# Patient Record
Sex: Female | Born: 1995 | Race: White | Hispanic: No | Marital: Single | Smoking: Current every day smoker
Health system: Southern US, Community
[De-identification: ages and names within clinical notes are randomized; demographics above are authoritative.]

## PROBLEM LIST (undated history)

## (undated) DIAGNOSIS — M6282 Rhabdomyolysis: Secondary | ICD-10-CM

## (undated) DIAGNOSIS — Z789 Other specified health status: Secondary | ICD-10-CM

## (undated) DIAGNOSIS — N179 Acute kidney failure, unspecified: Secondary | ICD-10-CM

## (undated) HISTORY — DX: Rhabdomyolysis: M62.82

## (undated) HISTORY — DX: Acute kidney failure, unspecified: N17.9

---
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
## 2018-11-20 ENCOUNTER — Inpatient Hospital Stay (HOSPITAL_COMMUNITY)
Admission: EM | Admit: 2018-11-20 | Discharge: 2018-11-26 | DRG: 917 | Disposition: A | Discharge status: Home / Self Care | Payer: BC Managed Care – PPO | Attending: Internal Medicine | Admitting: Internal Medicine

## 2018-11-20 ENCOUNTER — Emergency Department (HOSPITAL_COMMUNITY): Payer: BC Managed Care – PPO

## 2018-11-20 ENCOUNTER — Encounter (HOSPITAL_COMMUNITY): Payer: Self-pay | Admitting: Emergency Medicine

## 2018-11-20 ENCOUNTER — Other Ambulatory Visit: Payer: Self-pay

## 2018-11-20 DIAGNOSIS — IMO0002 Reserved for concepts with insufficient information to code with codable children: Secondary | ICD-10-CM

## 2018-11-20 DIAGNOSIS — F17290 Nicotine dependence, other tobacco product, uncomplicated: Secondary | ICD-10-CM | POA: Diagnosis present

## 2018-11-20 DIAGNOSIS — E872 Acidosis: Secondary | ICD-10-CM | POA: Diagnosis present

## 2018-11-20 DIAGNOSIS — N179 Acute kidney failure, unspecified: Secondary | ICD-10-CM | POA: Diagnosis present

## 2018-11-20 DIAGNOSIS — W503XXA Accidental bite by another person, initial encounter: Secondary | ICD-10-CM | POA: Diagnosis present

## 2018-11-20 DIAGNOSIS — F1210 Cannabis abuse, uncomplicated: Secondary | ICD-10-CM | POA: Diagnosis present

## 2018-11-20 DIAGNOSIS — D649 Anemia, unspecified: Secondary | ICD-10-CM | POA: Diagnosis present

## 2018-11-20 DIAGNOSIS — G40509 Epileptic seizures related to external causes, not intractable, without status epilepticus: Secondary | ICD-10-CM | POA: Diagnosis present

## 2018-11-20 DIAGNOSIS — Z793 Long term (current) use of hormonal contraceptives: Secondary | ICD-10-CM | POA: Diagnosis not present

## 2018-11-20 DIAGNOSIS — D72829 Elevated white blood cell count, unspecified: Secondary | ICD-10-CM | POA: Diagnosis present

## 2018-11-20 DIAGNOSIS — G92 Toxic encephalopathy: Secondary | ICD-10-CM | POA: Diagnosis present

## 2018-11-20 DIAGNOSIS — R030 Elevated blood-pressure reading, without diagnosis of hypertension: Secondary | ICD-10-CM | POA: Diagnosis present

## 2018-11-20 DIAGNOSIS — F1010 Alcohol abuse, uncomplicated: Secondary | ICD-10-CM | POA: Diagnosis present

## 2018-11-20 DIAGNOSIS — R809 Proteinuria, unspecified: Secondary | ICD-10-CM | POA: Diagnosis present

## 2018-11-20 DIAGNOSIS — R823 Hemoglobinuria: Secondary | ICD-10-CM | POA: Diagnosis present

## 2018-11-20 DIAGNOSIS — R509 Fever, unspecified: Secondary | ICD-10-CM | POA: Diagnosis not present

## 2018-11-20 DIAGNOSIS — T407X1A Poisoning by cannabis (derivatives), accidental (unintentional), initial encounter: Secondary | ICD-10-CM | POA: Diagnosis present

## 2018-11-20 DIAGNOSIS — M6282 Rhabdomyolysis: Secondary | ICD-10-CM | POA: Diagnosis present

## 2018-11-20 DIAGNOSIS — Z1159 Encounter for screening for other viral diseases: Secondary | ICD-10-CM

## 2018-11-20 DIAGNOSIS — R7989 Other specified abnormal findings of blood chemistry: Secondary | ICD-10-CM

## 2018-11-20 DIAGNOSIS — R569 Unspecified convulsions: Secondary | ICD-10-CM

## 2018-11-20 DIAGNOSIS — G9340 Encephalopathy, unspecified: Secondary | ICD-10-CM | POA: Diagnosis present

## 2018-11-20 DIAGNOSIS — R740 Nonspecific elevation of levels of transaminase and lactic acid dehydrogenase [LDH]: Secondary | ICD-10-CM | POA: Diagnosis present

## 2018-11-20 DIAGNOSIS — R824 Acetonuria: Secondary | ICD-10-CM | POA: Diagnosis present

## 2018-11-20 DIAGNOSIS — R81 Glycosuria: Secondary | ICD-10-CM | POA: Diagnosis present

## 2018-11-20 DIAGNOSIS — S01512A Laceration without foreign body of oral cavity, initial encounter: Secondary | ICD-10-CM | POA: Diagnosis present

## 2018-11-20 DIAGNOSIS — R945 Abnormal results of liver function studies: Secondary | ICD-10-CM | POA: Diagnosis not present

## 2018-11-20 HISTORY — DX: Other specified health status: Z78.9

## 2018-11-20 LAB — COMPREHENSIVE METABOLIC PANEL
ALT: 29 U/L (ref 0–44)
AST: 63 U/L — ABNORMAL HIGH (ref 15–41)
Albumin: 4.5 g/dL (ref 3.5–5.0)
Alkaline Phosphatase: 55 U/L (ref 38–126)
Anion gap: 13 (ref 5–15)
BUN: 24 mg/dL — ABNORMAL HIGH (ref 6–20)
CO2: 16 mmol/L — ABNORMAL LOW (ref 22–32)
Calcium: 9.6 mg/dL (ref 8.9–10.3)
Chloride: 110 mmol/L (ref 98–111)
Creatinine, Ser: 1.85 mg/dL — ABNORMAL HIGH (ref 0.44–1.00)
GFR calc Af Amer: 44 mL/min — ABNORMAL LOW (ref >60)
GFR calc non Af Amer: 38 mL/min — ABNORMAL LOW (ref >60)
Glucose, Bld: 136 mg/dL — ABNORMAL HIGH (ref 70–99)
Potassium: 4.8 mmol/L (ref 3.5–5.1)
Sodium: 139 mmol/L (ref 135–145)
Total Bilirubin: 1.1 mg/dL (ref 0.3–1.2)
Total Protein: 7.8 g/dL (ref 6.5–8.1)

## 2018-11-20 LAB — CSF CELL COUNT WITH DIFFERENTIAL
RBC Count, CSF: 126 /mm3 — ABNORMAL HIGH
RBC Count, CSF: 2 /mm3 — ABNORMAL HIGH
Tube #: 1
Tube #: 4
WBC, CSF: 1 /mm3 (ref 0–5)
WBC, CSF: 2 /mm3 (ref 0–5)

## 2018-11-20 LAB — CBC WITH DIFFERENTIAL/PLATELET
Abs Immature Granulocytes: 0.17 10*3/uL — ABNORMAL HIGH (ref 0.00–0.07)
Basophils Absolute: 0 10*3/uL (ref 0.0–0.1)
Basophils Relative: 0 %
Eosinophils Absolute: 0 10*3/uL (ref 0.0–0.5)
Eosinophils Relative: 0 %
HCT: 42 % (ref 36.0–46.0)
Hemoglobin: 14.3 g/dL (ref 12.0–15.0)
Immature Granulocytes: 1 %
Lymphocytes Relative: 1 %
Lymphs Abs: 0.2 10*3/uL — ABNORMAL LOW (ref 0.7–4.0)
MCH: 30.1 pg (ref 26.0–34.0)
MCHC: 34 g/dL (ref 30.0–36.0)
MCV: 88.4 fL (ref 80.0–100.0)
Monocytes Absolute: 0.8 10*3/uL (ref 0.1–1.0)
Monocytes Relative: 3 %
Neutro Abs: 22.5 10*3/uL — ABNORMAL HIGH (ref 1.7–7.7)
Neutrophils Relative %: 95 %
Platelets: 241 10*3/uL (ref 150–400)
RBC: 4.75 MIL/uL (ref 3.87–5.11)
RDW: 12.8 % (ref 11.5–15.5)
WBC: 24.5 10*3/uL — ABNORMAL HIGH (ref 4.0–10.5)
nRBC: 0 % (ref 0.0–0.2)

## 2018-11-20 LAB — HCG, QUANTITATIVE, PREGNANCY: hCG, Beta Chain, Quant, S: <1 m[IU]/mL (ref <5)

## 2018-11-20 LAB — URINALYSIS, ROUTINE W REFLEX MICROSCOPIC
Bacteria, UA: NONE SEEN
Bilirubin Urine: NEGATIVE
Glucose, UA: 50 mg/dL — AB
Ketones, ur: 5 mg/dL — AB
Leukocytes,Ua: NEGATIVE
Nitrite: NEGATIVE
Protein, ur: 100 mg/dL — AB
Specific Gravity, Urine: 1.012 (ref 1.005–1.030)
pH: 5 (ref 5.0–8.0)

## 2018-11-20 LAB — RAPID URINE DRUG SCREEN, HOSP PERFORMED
Amphetamines: NOT DETECTED
Barbiturates: NOT DETECTED
Benzodiazepines: NOT DETECTED
Cocaine: NOT DETECTED
Opiates: NOT DETECTED
Tetrahydrocannabinol: POSITIVE — AB

## 2018-11-20 LAB — PROTEIN AND GLUCOSE, CSF
Glucose, CSF: 88 mg/dL — ABNORMAL HIGH (ref 40–70)
Total  Protein, CSF: 23 mg/dL (ref 15–45)

## 2018-11-20 LAB — LACTIC ACID, PLASMA
Lactic Acid, Venous: 2.3 mmol/L (ref 0.5–1.9)
Lactic Acid, Venous: 1.7 mmol/L (ref 0.5–1.9)

## 2018-11-20 LAB — ETHANOL: Alcohol, Ethyl (B): <10 mg/dL (ref <10)

## 2018-11-20 LAB — MAGNESIUM: Magnesium: 2.8 mg/dL — ABNORMAL HIGH (ref 1.7–2.4)

## 2018-11-20 MED ORDER — ONDANSETRON HCL 4 MG/2ML IJ SOLN
4.0000 mg | Freq: Once | INTRAMUSCULAR | Status: AC
  Administered 2018-11-20: 17:00:00 4 mg via INTRAVENOUS

## 2018-11-20 MED ORDER — HALOPERIDOL LACTATE 5 MG/ML IJ SOLN
5.0000 mg | Freq: Once | INTRAMUSCULAR | Status: AC
  Administered 2018-11-20: 18:00:00 5 mg via INTRAVENOUS
  Filled 2018-11-20: qty 1

## 2018-11-20 MED ORDER — ACETAMINOPHEN 325 MG PO TABS
650.0000 mg | ORAL_TABLET | Freq: Four times a day (QID) | ORAL | Status: DC | PRN
  Administered 2018-11-22 – 2018-11-23 (×2): 650 mg via ORAL
  Filled 2018-11-20 (×2): qty 2

## 2018-11-20 MED ORDER — SODIUM CHLORIDE 0.9 % IV SOLN
INTRAVENOUS | Status: DC
  Administered 2018-11-20: 22:00:00 via INTRAVENOUS

## 2018-11-20 MED ORDER — LEVETIRACETAM IN NACL 1000 MG/100ML IV SOLN
1000.0000 mg | Freq: Once | INTRAVENOUS | Status: AC
  Administered 2018-11-20: 22:00:00 1000 mg via INTRAVENOUS
  Filled 2018-11-20: qty 100

## 2018-11-20 MED ORDER — LORAZEPAM 2 MG/ML IJ SOLN: 1.0000 mg | INTRAMUSCULAR | Status: DC | PRN

## 2018-11-20 MED ORDER — PROCHLORPERAZINE EDISYLATE 10 MG/2ML IJ SOLN: 5.0000 mg | INTRAMUSCULAR | Status: DC | PRN

## 2018-11-20 MED ORDER — LEVETIRACETAM IN NACL 500 MG/100ML IV SOLN
500.0000 mg | Freq: Two times a day (BID) | INTRAVENOUS | Status: DC
  Administered 2018-11-21 (×2): 500 mg via INTRAVENOUS
  Filled 2018-11-20 (×2): qty 100

## 2018-11-20 MED ORDER — LORAZEPAM 2 MG/ML IJ SOLN
1.0000 mg | Freq: Once | INTRAMUSCULAR | Status: AC
  Administered 2018-11-20: 1 mg via INTRAVENOUS
  Filled 2018-11-20: qty 1

## 2018-11-20 MED ORDER — ACETAMINOPHEN 650 MG RE SUPP: 650.0000 mg | Freq: Four times a day (QID) | RECTAL | Status: DC | PRN

## 2018-11-20 MED ORDER — ONDANSETRON HCL 4 MG/2ML IJ SOLN
INTRAMUSCULAR | Status: AC
  Administered 2018-11-20: 4 mg via INTRAVENOUS
  Filled 2018-11-20: qty 2

## 2018-11-20 MED ORDER — CHLORHEXIDINE GLUCONATE CLOTH 2 % EX PADS
6.0000 | MEDICATED_PAD | Freq: Every day | CUTANEOUS | Status: DC
  Administered 2018-11-21 – 2018-11-25 (×4): 6 via TOPICAL

## 2018-11-20 MED ORDER — LEVETIRACETAM IN NACL 1000 MG/100ML IV SOLN: 1000.0000 mg | Freq: Two times a day (BID) | INTRAVENOUS | Status: DC

## 2018-11-20 MED ORDER — SODIUM CHLORIDE 0.9 % IV BOLUS
1000.0000 mL | Freq: Once | INTRAVENOUS | Status: AC
  Administered 2018-11-20: 1000 mL via INTRAVENOUS

## 2018-11-20 MED ORDER — SODIUM CHLORIDE 0.9 % IV BOLUS
1000.0000 mL | Freq: Once | INTRAVENOUS | Status: AC
  Administered 2018-11-20: 22:00:00 1000 mL via INTRAVENOUS

## 2018-11-20 NOTE — ED Notes (Signed)
Father called. Updated that pt was going to be an admission

## 2018-11-20 NOTE — ED Notes (Signed)
hospitalist attempt to call father. Leaving voicemail

## 2018-11-20 NOTE — H&P (Signed)
History and Physical    Carla HeronJessica R Longino ZOX:[id number] DOB: 1995/06/24 DOA: 11/20/2018  PCP: Patient, No Pcp Per   Patient coming from: Home.  I have personally briefly reviewed patient[facility]  Chief Complaint: Seizure.  HPI: Carla Gross is a 23 y.o. female with no previous past medical history who is brought to the emergency department via EMS after having seizure-like activity at home.  The family reported that the patient drank about 4 white claws drinks (about 5% alcohol) over several hours while at the pool and got sick.  She has multiple episodes incontinence and emesis.  Today she had was seen to be seizure-like activity according to what the family witness and they gave her CBD oil under her tongue.  She is currently at times answering simple questions, but is disoriented and restless at times.  She is unable to provide further information.  ED Course: Initial vital signs temperature 98 F, pulse 56, respirations 16, blood pressure 124/67 mmHg and O2 sat 99% on room air.  She received a 1000 mL NS bolus, lorazepam and allopurinol prior to undergoing a lumbar puncture.  I added on another 1000 mL's of normal saline bolus.  Her urinalysis was hazy in appearance, high glucosuria of 50 mg/dL, large hemoglobinuria, ketonuria 5 and proteinuria of 100 mg/dL.  Microscopic examination only shows 0-5 RBC.  UDS was negative. CBC showed a white count of 24.5, hemoglobin 14.3 g/dL and platelets [id number].  Alcohol level was less than 10.  Sodium 139, potassium 4.8, chloride 110 and CO2 16 mmol/L.  Anion gap was 13.  Glucose was 136, BUN 24 and creatinine 1.85 mg/dL.  AST was 63, but all other hepatic function tests were within normal limits.  Serum hCG was negative.  HIV antibody and urine culture are pending.  Total CK was 3642.  Imaging: CT head and chest radiograph were within normal limits.  Review of Systems:  Unable to obtain.  History reviewed. No pertinent  past medical history.  History reviewed. No pertinent surgical history.  Her father reports that she has been smoking e-cigarettes. She does not have any smokeless tobacco history on file. She drinks alcohol on occasion and was THC positive on her UDS. However, she was given CBD earlier by her boyfriend.  No Known Allergies  Family History  Family history unknown: Yes    Prior to Admission medications   Not on File    Physical Exam: Vitals:   11/20/18 1930 11/20/18 2000 11/20/18 2030 11/20/18 2100  BP: 119/80 (!) 141/85 (!) 131/95 118/80  Pulse:    66  Resp:    16  Temp:      TempSrc:      SpO2:    98%  Weight:      Height:        Constitutional: Sedated and confused. Eyes: PERRL, lids and conjunctivae mildly injected. ENMT: Mucous membranes are dry. Posterior pharynx clear of any exudate or lesions. Neck: normal, supple, no masses, no thyromegaly Respiratory: Decreased breath sounds in bases, but otherwise clear to auscultation bilaterally, no wheezing, no crackles. Normal respiratory effort. No accessory muscle use.  Cardiovascular: Regular rate and rhythm, no murmurs / rubs / gallops. No extremity edema. 2+ pedal pulses. No carotid bruits.  Abdomen: Soft, no tenderness, no masses palpated. No hepatosplenomegaly. Bowel sounds positive.  Musculoskeletal: no clubbing / cyanosis. Good ROM, no contractures.  Relaxed muscle tone.  Skin: no rashes, lesions, ulcers on very limited  dermatological examination. Neurologic: Unable to fully evaluate, as the patient is unable to cooperate.  But the patient seems to be nonfocal. Psychiatric: Somnolent.  Wakes up briefly.  May or may not answer simple questions.  Labs on Admission: I have personally reviewed following labs and imaging studies  CBC: Recent Labs  Lab 11/20/18 1658  WBC 24.5*  NEUTROABS 22.5*  HGB 14.3  HCT 42.0  MCV 88.4  PLT 241   Basic Metabolic Panel: Recent Labs  Lab 11/20/18 1658  NA 139  K 4.8  CL  110  CO2 16*  GLUCOSE 136*  BUN 24*  CREATININE 1.85*  CALCIUM 9.6   GFR: Estimated Creatinine Clearance: 53.3 mL/min (A) (by C-G formula based on SCr of 1.85 mg/dL (H)). Liver Function Tests: Recent Labs  Lab 11/20/18 1658  AST 63*  ALT 29  ALKPHOS 55  BILITOT 1.1  PROT 7.8  ALBUMIN 4.5   No results for input(s): LIPASE, AMYLASE in the last 168 hours. No results for input(s): AMMONIA in the last 168 hours. Coagulation Profile: No results for input(s): INR, PROTIME in the last 168 hours. Cardiac Enzymes: No results for input(s): CKTOTAL, CKMB, CKMBINDEX, TROPONINI in the last 168 hours. BNP (last 3 results) No results for input(s): PROBNP in the last 8760 hours. HbA1C: No results for input(s): HGBA1C in the last 72 hours. CBG: No results for input(s): GLUCAP in the last 168 hours. Lipid Profile: No results for input(s): CHOL, HDL, LDLCALC, TRIG, CHOLHDL, LDLDIRECT in the last 72 hours. Thyroid Function Tests: No results for input(s): TSH, T4TOTAL, FREET4, T3FREE, THYROIDAB in the last 72 hours. Anemia Panel: No results for input(s): VITAMINB12, FOLATE, FERRITIN, TIBC, IRON, RETICCTPCT in the last 72 hours. Urine analysis:    Component Value Date/Time   COLORURINE YELLOW 11/20/2018 1810   APPEARANCEUR HAZY (A) 11/20/2018 1810   LABSPEC 1.012 11/20/2018 1810   PHURINE 5.0 11/20/2018 1810   GLUCOSEU 50 (A) 11/20/2018 1810   HGBUR LARGE (A) 11/20/2018 1810   BILIRUBINUR NEGATIVE 11/20/2018 1810   KETONESUR 5 (A) 11/20/2018 1810   PROTEINUR 100 (A) 11/20/2018 1810   NITRITE NEGATIVE 11/20/2018 1810   LEUKOCYTESUR NEGATIVE 11/20/2018 1810    Radiological Exams on Admission: Ct Head Wo Contrast  Result Date: 11/20/2018 CLINICAL DATA:  Per ED note. Pt brought in by ems after family called for possible seizure. Family reports she drank a few white claws yesterday by the pool and got sick. Pt had multiple episodes of urinating on self with vomiting. Today had a possible  seizure per ems and family gave cbd oil under tongue. EXAM: CT HEAD WITHOUT CONTRAST TECHNIQUE: Contiguous axial images were obtained from the base of the skull through the vertex without intravenous contrast. COMPARISON:  None. FINDINGS: Brain: No evidence of acute infarction, hemorrhage, hydrocephalus, extra-axial collection or mass lesion/mass effect. Vascular: No hyperdense vessel or unexpected calcification. Skull: Normal. Negative for fracture or focal lesion. Sinuses/Orbits: Globes and orbits are unremarkable. Mucous retention cyst in the left sphenoid sinus. Mild, right greater than left, polypoid mucosal thickening in the inferior maxillary sinuses. Clear mastoid air cells. Other: None. IMPRESSION: 1. No intracranial abnormality. 2. Sinus mucosal thickening as described. Electronically Signed   By: Amie Portlandavid  Ormond M.D.   On: 11/20/2018 17:57   Dg Chest Portable 1 View  Result Date: 11/20/2018 CLINICAL DATA:  Altered mental status. EXAM: PORTABLE CHEST 1 VIEW COMPARISON:  None. FINDINGS: Normal heart, mediastinum and hila. Clear lungs. No evidence of a pleural effusion. No  gross pneumothorax on this supine exam. Skeletal structures are unremarkable. IMPRESSION: No active disease. Electronically Signed   By: Lajean Manes M.D.   On: 11/20/2018 17:58    EKG: Independently reviewed. Vent. rate 62 BPM PR interval * ms QRS duration 87 ms QT/QTc 435/442 ms P-R-T axes 17 77 55 Sinus arrhythmia  Assessment/Plan Principal Problem:   Acute encephalopathy Admit to stepdown/inpatient. Frequent neuro checks. Continue IV fluids. Check CBG every 6 hours. Follow-up CBC and CMP in a.m. Further treatment as below for seizure.  Active Problems:   Seizure [facility]) Discussed briefly with neurology on call. In addition to EEG, and MRI. They suggested to use Keppra preventatively. If EEG is negative, Keppra should be taper off. Lorazepam as needed. Will check 9 substance UDS in the morning. Monitor CBG  every 6 hours.    AKI (acute kidney injury) ([place]) Continue IV fluids. Monitor intake and output.  CK level. Follow-up renal function and electrolytes.    Leukocytosis No fever, checks x-ray clear, UA noninfectious. Might be related to the stress of the last 24 hours. Continue current treatment and recheck CBC in a.m.   DVT prophylaxis: SCDs. Code Status: Full code. Family Communication:  Discussed at length with her father Dominica Severin.   He would like to be updated daily. Disposition Plan:  Admit for further treatment and work-up for 2 to 3 days as earlier stated. Consults called: Admission status:  Stepdown/inpatient.   Reubin Milan MD [facility]  11/20/2018, 9:41 PM  This document was prepared using Dragon voice recognition software and may contain some unintended transcription errors.

## 2018-11-20 NOTE — ED Notes (Signed)
hospitalist in room  

## 2018-11-20 NOTE — ED Triage Notes (Signed)
Pt brought in by ems after family called for possible seizure. Family reports she drank a few white claws yesterday by the pool and got sick. Pt had multiple episodes of urinating on self with vomiting. Today had a possible seizure per ems and family gave cbd oil under tongue.

## 2018-11-20 NOTE — ED Notes (Signed)
Date and time results received: 11/20/18 6:38 PM  (use smartphrase ".now" to insert current time)  Test: Lactic Critical Value: 2.3  Name of Provider Notified: Kohut  Orders Received? Or Actions Taken?: Orders Received - See Orders for details

## 2018-11-20 NOTE — ED Provider Notes (Signed)
[facility] EMERGENCY DEPARTMENT Provider Note   CSN: [id number] Arrival date & time: 11/20/18  1632     History   Chief Complaint Chief Complaint  Patient presents with  . Altered Mental Status    HPI Carla Gross is a 23 y.o. female.     HPI   22yF with AMS. Drinking at the pool yesterday and developed nausea and vomiting. That continued today and also incontinent of urine. Possible seizure today w/ no reported prior seizure history. She is answering questions but clearly confused. Says she feels sick but has a hard time describing symptoms beyond this. She is restless and and keeps repositioning herself in the bed, pulling off her gown/covers, etc.   History reviewed. No pertinent past medical history.  There are no active problems to display for this patient.   History reviewed. No pertinent surgical history.   OB History   No obstetric history on file.      Home Medications    Prior to Admission medications   Not on File    Family History History reviewed. No pertinent family history.  Social History Social History   Tobacco Use  . Smoking status: Not on file  Substance Use Topics  . Alcohol use: Not on file  . Drug use: Not on file     Allergies   Patient has no known allergies.   Review of Systems Review of Systems  Level 5 caveat because of confusion.  Physical Exam Updated Vital Signs BP 124/80   Pulse (!) 58   Temp 98 F (36.7 C)   Resp 18   Ht 5\' 7"  (1.702 m)   Wt 86.2 kg   SpO2 99%   BMI 29.76 kg/m   Physical Exam Vitals signs and nursing note reviewed.  Constitutional:      Appearance: She is well-developed.     Comments: Laying in bed awake. Restless. Keeps repositioning herself in the bed. Incontinent of urine.   HENT:     Head: Normocephalic and atraumatic.  Eyes:     General:        Right eye: No discharge.        Left eye: No discharge.     Conjunctiva/sclera: Conjunctivae normal.  Neck:   Musculoskeletal: Neck supple. No neck rigidity.  Cardiovascular:     Rate and Rhythm: Normal rate and regular rhythm.     Heart sounds: Normal heart sounds. No murmur. No friction rub. No gallop.   Pulmonary:     Effort: Pulmonary effort is normal. No respiratory distress.     Breath sounds: Normal breath sounds.  Abdominal:     General: There is no distension.     Palpations: Abdomen is soft.     Tenderness: There is no abdominal tenderness.  Musculoskeletal:        General: No tenderness.  Skin:    General: Skin is warm and dry.  Neurological:     Mental Status: She is alert.     Comments: Able to tell me the correct month but that 2019. Able to recognize that she is in the hospital but cannot explain to me the events leading up to this or provide a reason why she may be here. She will follow simple commands but has to be constantly reminded and will seem to lose her train of thought.       ED Treatments / Results  Labs (all labs ordered are listed, but only abnormal results are displayed) Labs Reviewed  RAPID URINE DRUG SCREEN, HOSP PERFORMED - Abnormal; Notable for the following components:      Result Value   Tetrahydrocannabinol POSITIVE (*)    All other components within normal limits  CBC WITH DIFFERENTIAL/PLATELET - Abnormal; Notable for the following components:   WBC 24.5 (*)    Neutro Abs 22.5 (*)    Lymphs Abs 0.2 (*)    Abs Immature Granulocytes 0.17 (*)    All other components within normal limits  COMPREHENSIVE METABOLIC PANEL - Abnormal; Notable for the following components:   CO2 16 (*)    Glucose, Bld 136 (*)    BUN 24 (*)    Creatinine, Ser 1.85 (*)    AST 63 (*)    GFR calc non Af Amer 38 (*)    GFR calc Af Amer 44 (*)    All other components within normal limits  URINALYSIS, ROUTINE W REFLEX MICROSCOPIC - Abnormal; Notable for the following components:   APPearance HAZY (*)    Glucose, UA 50 (*)    Hgb urine dipstick LARGE (*)    Ketones, ur 5  (*)    Protein, ur 100 (*)    All other components within normal limits  LACTIC ACID, PLASMA - Abnormal; Notable for the following components:   Lactic Acid, Venous 2.3 (*)    All other components within normal limits  CSF CELL COUNT WITH DIFFERENTIAL - Abnormal; Notable for the following components:   Appearance, CSF CLEAR (*)    RBC Count, CSF 126 (*)    All other components within normal limits  CSF CELL COUNT WITH DIFFERENTIAL - Abnormal; Notable for the following components:   Appearance, CSF CLEAR (*)    RBC Count, CSF 2 (*)    All other components within normal limits  PROTEIN AND GLUCOSE, CSF - Abnormal; Notable for the following components:   Glucose, CSF 88 (*)    All other components within normal limits  CULTURE, BLOOD (ROUTINE X 2)  CULTURE, BLOOD (ROUTINE X 2)  CSF CULTURE  URINE CULTURE  NOVEL CORONAVIRUS, NAA (HOSPITAL ORDER, SEND-OUT TO REF LAB)  ETHANOL  HCG, QUANTITATIVE, PREGNANCY  LACTIC ACID, PLASMA  HIV ANTIBODY (ROUTINE TESTING W REFLEX)  HERPES SIMPLEX VIRUS(HSV) DNA BY PCR    EKG EKG Interpretation  Date/Time:  Sunday November 20 2018 16:55:05 EDT Ventricular Rate:  62 PR Interval:    QRS Duration: 87 QT Interval:  435 QTC Calculation: 442 R Axis:   77 Text Interpretation:  Sinus arrhythmia No old tracing to compare Confirmed by Raeford RazorKohut, Meagon Duskin [phone]([id number]) on 11/20/2018 5:07:10 PM   Radiology Ct Head Wo Contrast  Result Date: 11/20/2018 CLINICAL DATA:  Per ED note. Pt brought in by ems after family called for possible seizure. Family reports she drank a few white claws yesterday by the pool and got sick. Pt had multiple episodes of urinating on self with vomiting. Today had a possible seizure per ems and family gave cbd oil under tongue. EXAM: CT HEAD WITHOUT CONTRAST TECHNIQUE: Contiguous axial images were obtained from the base of the skull through the vertex without intravenous contrast. COMPARISON:  None. FINDINGS: Brain: No evidence of acute  infarction, hemorrhage, hydrocephalus, extra-axial collection or mass lesion/mass effect. Vascular: No hyperdense vessel or unexpected calcification. Skull: Normal. Negative for fracture or focal lesion. Sinuses/Orbits: Globes and orbits are unremarkable. Mucous retention cyst in the left sphenoid sinus. Mild, right greater than left, polypoid mucosal thickening in the inferior maxillary sinuses. Clear mastoid air cells. Other:  None. IMPRESSION: 1. No intracranial abnormality. 2. Sinus mucosal thickening as described. Electronically Signed   By: Lajean Manes M.D.   On: 11/20/2018 17:57   Dg Chest Portable 1 View  Result Date: 11/20/2018 CLINICAL DATA:  Altered mental status. EXAM: PORTABLE CHEST 1 VIEW COMPARISON:  None. FINDINGS: Normal heart, mediastinum and hila. Clear lungs. No evidence of a pleural effusion. No Gross pneumothorax on this supine exam. Skeletal structures are unremarkable. IMPRESSION: No active disease. Electronically Signed   By: Lajean Manes M.D.   On: 11/20/2018 17:58    Procedures .Lumbar Puncture  Date/Time: 11/20/2018 7:05 PM Performed by: Virgel Manifold, MD Authorized by: Virgel Manifold, MD   Consent:    Consent obtained:  Emergent situation Pre-procedure details:    Procedure purpose:  Diagnostic   Preparation: Patient was prepped and draped in usual sterile fashion   Sedation:    Sedation type:  Anxiolysis Anesthesia (see MAR for exact dosages):    Anesthesia method:  Local infiltration Procedure details:    Lumbar space:  L3-L4 interspace   Patient position:  L lateral decubitus   Needle gauge:  20   Needle type:  Spinal needle - Quincke tip   Needle length (in):  3.5   Ultrasound guidance: no     Number of attempts:  1   Fluid appearance:  Clear   Tubes of fluid:  4   Total volume (ml):  5 Post-procedure:    Puncture site:  Adhesive bandage applied    CRITICAL CARE Performed by: Virgel Manifold Total critical care time: 35 minutes Critical care  time was exclusive of separately billable procedures and treating other patients. Critical care was necessary to treat or prevent imminent or life-threatening deterioration. Critical care was time spent personally by me on the following activities: development of treatment plan with patient and/or surrogate as well as nursing, discussions with consultants, evaluation of patient's response to treatment, examination of patient, obtaining history from patient or surrogate, ordering and performing treatments and interventions, ordering and review of laboratory studies, ordering and review of radiographic studies, pulse oximetry and re-evaluation of patient's condition.  (including critical care time)  Medications Ordered in ED Medications  sodium chloride 0.9 % bolus 1,000 mL (has no administration in time range)  haloperidol lactate (HALDOL) injection 5 mg (has no administration in time range)  LORazepam (ATIVAN) injection 1 mg (has no administration in time range)  ondansetron (ZOFRAN) injection 4 mg (4 mg Intravenous Given 11/20/18 1658)     Initial Impression / Assessment and Plan / ED Course  I have reviewed the triage vital signs and the nursing notes.  Pertinent labs & imaging results that were available during my care of the patient were reviewed by me and considered in my medical decision making (see chart for details).    22yF with AMS. Reportedly multiple episodes of vomiting and incontinent of urine. Possibly new onset seizure. She is currently confused. Ethanol is normal. Reportedly given CBD oil for the vomiting but no reported ingestion otherwise. CT head w/o acute abnormality. She is afebrile but has significant leukocytosis. She doesn't seem to have nuchal rigidity the best I can tell but will need an LP.   LP looks ok. Pt still altered but HD stable.  Will need admitted.   Final Clinical Impressions(s) / ED Diagnoses   Final diagnoses:  Encephalopathy    ED Discharge  Orders    None       Virgel Manifold, MD 11/21/18 1811

## 2018-11-20 NOTE — ED Notes (Signed)
Unable to keep monitor on patient . Pt keeps removing leads and clothes

## 2018-11-20 NOTE — ED Notes (Signed)
[place] - pt dad

## 2018-11-21 ENCOUNTER — Inpatient Hospital Stay (HOSPITAL_COMMUNITY)
Admit: 2018-11-21 | Discharge: 2018-11-21 | Disposition: A | Discharge status: Home / Self Care | Payer: BC Managed Care – PPO | Attending: Internal Medicine | Admitting: Internal Medicine

## 2018-11-21 ENCOUNTER — Encounter (HOSPITAL_COMMUNITY): Payer: Self-pay | Admitting: Emergency Medicine

## 2018-11-21 DIAGNOSIS — G9340 Encephalopathy, unspecified: Secondary | ICD-10-CM

## 2018-11-21 DIAGNOSIS — F1210 Cannabis abuse, uncomplicated: Secondary | ICD-10-CM | POA: Diagnosis present

## 2018-11-21 DIAGNOSIS — M6282 Rhabdomyolysis: Secondary | ICD-10-CM | POA: Diagnosis present

## 2018-11-21 LAB — CBC
HCT: 34.3 % — ABNORMAL LOW (ref 36.0–46.0)
Hemoglobin: 11.4 g/dL — ABNORMAL LOW (ref 12.0–15.0)
MCH: 30 pg (ref 26.0–34.0)
MCHC: 33.2 g/dL (ref 30.0–36.0)
MCV: 90.3 fL (ref 80.0–100.0)
Platelets: 146 10*3/uL — ABNORMAL LOW (ref 150–400)
RBC: 3.8 MIL/uL — ABNORMAL LOW (ref 3.87–5.11)
RDW: 12.9 % (ref 11.5–15.5)
WBC: 13.9 10*3/uL — ABNORMAL HIGH (ref 4.0–10.5)
nRBC: 0 % (ref 0.0–0.2)

## 2018-11-21 LAB — COMPREHENSIVE METABOLIC PANEL
ALT: 34 U/L (ref 0–44)
AST: 111 U/L — ABNORMAL HIGH (ref 15–41)
Albumin: 3.4 g/dL — ABNORMAL LOW (ref 3.5–5.0)
Alkaline Phosphatase: 40 U/L (ref 38–126)
Anion gap: 8 (ref 5–15)
BUN: 31 mg/dL — ABNORMAL HIGH (ref 6–20)
CO2: 17 mmol/L — ABNORMAL LOW (ref 22–32)
Calcium: 8.3 mg/dL — ABNORMAL LOW (ref 8.9–10.3)
Chloride: 116 mmol/L — ABNORMAL HIGH (ref 98–111)
Creatinine, Ser: 3.3 mg/dL — ABNORMAL HIGH (ref 0.44–1.00)
GFR calc Af Amer: 22 mL/min — ABNORMAL LOW (ref >60)
GFR calc non Af Amer: 19 mL/min — ABNORMAL LOW (ref >60)
Glucose, Bld: 127 mg/dL — ABNORMAL HIGH (ref 70–99)
Potassium: 4.5 mmol/L (ref 3.5–5.1)
Sodium: 141 mmol/L (ref 135–145)
Total Bilirubin: 1.3 mg/dL — ABNORMAL HIGH (ref 0.3–1.2)
Total Protein: 6 g/dL — ABNORMAL LOW (ref 6.5–8.1)

## 2018-11-21 LAB — CK
Total CK: 3642 U/L — ABNORMAL HIGH (ref 38–234)
Total CK: 8074 U/L — ABNORMAL HIGH (ref 38–234)

## 2018-11-21 LAB — GLUCOSE, CAPILLARY
Glucose-Capillary: 101 mg/dL — ABNORMAL HIGH (ref 70–99)
Glucose-Capillary: 106 mg/dL — ABNORMAL HIGH (ref 70–99)
Glucose-Capillary: 109 mg/dL — ABNORMAL HIGH (ref 70–99)
Glucose-Capillary: 93 mg/dL (ref 70–99)

## 2018-11-21 LAB — MRSA PCR SCREENING: MRSA by PCR: NEGATIVE

## 2018-11-21 MED ORDER — SODIUM BICARBONATE 8.4 % IV SOLN
INTRAVENOUS | Status: AC
  Filled 2018-11-21: qty 50

## 2018-11-21 MED ORDER — SODIUM BICARBONATE 8.4 % IV SOLN
INTRAVENOUS | Status: DC
  Administered 2018-11-21 (×3): via INTRAVENOUS
  Filled 2018-11-21 (×8): qty 50

## 2018-11-21 MED ORDER — SODIUM CHLORIDE 0.9 % IV BOLUS
1000.0000 mL | Freq: Once | INTRAVENOUS | Status: AC
  Administered 2018-11-21: 1000 mL via INTRAVENOUS

## 2018-11-21 NOTE — Progress Notes (Signed)
Night shift hospitalist addendum note.  I have ordered UDS-9, which is a send out lab that will need to be collected around 0900 then get frozen before collection to take to [facility] and then will be pick up at 1100. I believe that this may still yield some results as to what could be the etiology of her encephalopathy, nausea and vomiting, seizures and rhabdomyolisis.  Her symptomatology and findings are typical of PCP (phencyclidine) and to a lesser extent synthetic cannabinoid laced Cannabis given the presence of THC on her initial UDS.  However, the patient was given CBD oil by family members and it is unknown if the patient uses CBD oil on a regular basis or not, which can also contain small amounts of THC.   Tennis Must, MD.

## 2018-11-21 NOTE — Progress Notes (Signed)
Bladder scanned PT. 206ML in bladder at this time. MD notified.

## 2018-11-21 NOTE — Procedures (Signed)
  [place] A. Merlene Laughter, MD     [email or web address]           HISTORY: This is a 23 year old female who presents with altered mental status and seizures.  MEDICATIONS: No current facility-administered medications for this encounter.  No current outpatient medications on file.  Facility-Administered Medications Ordered in Other Encounters:  .  acetaminophen (TYLENOL) tablet 650 mg, 650 mg, Oral, Q6H PRN **OR** acetaminophen (TYLENOL) suppository 650 mg, 650 mg, Rectal, Q6H PRN, Reubin Milan, MD .  Chlorhexidine Gluconate Cloth 2 % PADS 6 each, 6 each, Topical, Daily, Reubin Milan, MD, 6 each at 11/21/18 1007 .  levETIRAcetam (KEPPRA) IVPB 500 mg/100 mL premix, 500 mg, Intravenous, Q12H, Reubin Milan, MD, Stopped at 11/21/18 [phone] .  LORazepam (ATIVAN) injection 1 mg, 1 mg, Intravenous, Q4H PRN, Reubin Milan, MD .  prochlorperazine (COMPAZINE) injection 5 mg, 5 mg, Intravenous, Q4H PRN, Reubin Milan, MD .  sodium bicarbonate 50 mEq in dextrose 5 % 1,000 mL infusion, , Intravenous, Continuous, Reubin Milan, MD, Last Rate: 125 mL/hr at 11/21/18 1007     ANALYSIS: A 16 channel recording using standard 10 20 measurements is conducted for 22 minutes.   There is a well-formed posterior dominant rhythm of 8.5 hertz which attenuates with eye-opening.  There is beta activity observed in the frontal areas.  Normal myogenic interference is noted from time to time. Photic stimulation and hyperventilation are not conducted.  There are multiple episodes of rhythmic and generalized delta activity with frontal central maximum.  Drowsiness is seen and transitions to spindles and K complexes at times associated with vertex sharp waves indicating brief episodes of stage II non-REM sleep.  There is no focal or lateralized slowing.  There is no epileptiform activity is observed.   IMPRESSION: 1.   This recording shows episodes of generalized  intermittent rhythmic delta activity typically seen in toxic metabolic processes.  Otherwise the recording is unremarkable.        Devereaux Grayson A. Merlene Laughter, M.D.  Diplomate, Tax adviser of Psychiatry and Neurology ( Neurology).

## 2018-11-21 NOTE — Progress Notes (Signed)
Patient Demographics:    Carla FiedlerJessica Gross, is a 23 y.o. female, DOB - [birth year], WUJ:[id number]  Admit date - 11/20/2018   Admitting Physician Bobette Moavid Manuel Ortiz, MD  Outpatient Primary MD for the patient is Patient, No Pcp Per  LOS - 1   Chief Complaint  Patient presents with  . Altered Mental Status        Subjective:    Carla FiedlerJessica Gross today has no fevers, no emesis,  No chest pain, wakes up easily but falls asleep very easily to while awake she is coherent and oriented x3.Marland Kitchen.... Additional history obtained from patient's significant other at patient request,... Patient and significant other states that he has been using concentrated THC compounds  Assessment  & Plan :    Principal Problem:   Acute Toxic encephalopathy Active Problems:   Seizure (HCC)   Rhabdomyolysis   AKI (acute kidney injury) (HCC)   Leukocytosis   Marijuana abuse, continuous  Brief Summary 23 year old without significant past medical history and specifically no prior history of seizures admitted on 11/20/2018 with concerns about new onset seizures in the setting of THC  and alcohol use   A/p 1)Acute Toxic encephalopathy----secondary to "substance ingestion" compounded by postictal state, CT head without acute findings, UA is not suggestive of UTI, and chest x-ray without pneumonia, lactic acid is down from 2.3 to 1.7 post hydration... Lumbar puncture noted, CSF studies not consistent with infectious process, CSF Gram stain with no organisms WBC, await CSF and blood cultures  2)Seizures--- no prior history of seizure disorder suspect drug-induced seizures patient has been using excessive amounts of concentrated THC compounds as well as alcohol--- EEG pending, currently on Keppra ... May not need Keppra upon discharge  3)Rhabdomyolysis  --- worsening, CK is trending up up to 8074 , suspect due to seizures, continue to hydrate IV  and.p.o.  4)Acute kidney injury with non-anion gap metabolic acidosis----renal function is worsening, creatinine trending up bicarb 17, anion gap is 8, creatinine is up to 3.3, was 1.8 on admission, no recent renal function available, suspect patient had normal renal function previously, avoid hypotension, avoid nephrotoxic agents  5)Leukocytosis--suspect this is reactive in the setting of seizures, WBC is down to 13.9 from 24.5, patient without fevers, chest x-ray without acute findings, UA not suggestive of UTI, CSF studies and cultures pending   Disposition/Need for in-Hospital Stay- patient unable to be discharged at this time due to rhabdomyolysis and acute kidney injury requiring IV fluids and bicarb infusion  Code Status : Full  Family Communication:    Spoke with patient's boyfriend/significant other at patient request   Disposition Plan  : To be determined  Consults  :  na  DVT Prophylaxis  :    Heparin - SCDs  Lab Results  Component Value Date   PLT 146 (L) 11/21/2018    Inpatient Medications  Scheduled Meds: . Chlorhexidine Gluconate Cloth  6 each Topical Daily   Continuous Infusions: . levETIRAcetam Stopped (11/21/18 0937)  .  sodium bicarbonate  infusion 1000 mL 125 mL/hr at 11/21/18 1007   PRN Meds:.acetaminophen **OR** acetaminophen, LORazepam, prochlorperazine    Anti-infectives (From admission, onward)   None       Objective:   Vitals:   11/21/18 1100 11/21/18 1140  11/21/18 1200 11/21/18 1230  BP: 112/65  97/67 116/83  Pulse: 78 70 84 84  Resp: 18 15 16 20   Temp:  98.7 F (37.1 C)    TempSrc:  Oral    SpO2: 100% 100% 99% 100%  Weight:      Height:        Wt Readings from Last 3 Encounters:  11/21/18 72.2 kg     Intake/Output Summary (Last 24 hours) at 11/21/2018 1416 Last data filed at 11/21/2018 1248 Gross per 24 hour  Intake 1785.51 ml  Output -  Net 1785.51 ml    Physical Exam  Gen:- Awake Alert, in no apparent distress   HEENT:- [place].AT, No sclera icterus Mouth-----bite marks/injury on patient's tongue Neck-Supple Neck,No JVD,.  Lungs-  CTAB , fair symmetrical air movement CV- S1, S2 normal, regular  Abd-  +ve B.Sounds, Abd Soft, No tenderness,    Extremity/Skin:- No  edema, pedal pulses present  Psych-affect is appropriate, oriented x3 Neuro-Sleepy on and off, no new focal deficits, no tremors   Data Review:   Micro Results Recent Results (from the past 240 hour(s))  Blood culture (routine x 2)     Status: None (Preliminary result)   Collection Time: 11/20/18  6:00 PM   Specimen: BLOOD LEFT ARM  Result Value Ref Range Status   Specimen Description BLOOD LEFT ARM  Final   Special Requests   Final    BOTTLES DRAWN AEROBIC AND ANAEROBIC Blood Culture adequate volume Performed at [facility], [address], [place], [place] [address]    Culture PENDING  Incomplete   Report Status PENDING  Incomplete  Blood culture (routine x 2)     Status: None (Preliminary result)   Collection Time: 11/20/18  6:17 PM   Specimen: BLOOD RIGHT HAND  Result Value Ref Range Status   Specimen Description BLOOD RIGHT HAND  Final   Special Requests   Final    BOTTLES DRAWN AEROBIC AND ANAEROBIC Blood Culture adequate volume Performed at [facility], [address] [place], [place] [address]    Culture PENDING  Incomplete   Report Status PENDING  Incomplete  CSF culture     Status: None (Preliminary result)   Collection Time: 11/20/18  7:13 PM   Specimen: CSF; Cerebrospinal Fluid  Result Value Ref Range Status   Specimen Description CSF  Final   Special Requests NONE  Final   Gram Stain   Final    NO CELLS OR ORGANISMS SEEN Gram Stain Report Called to,Read Back By and Verified With: NICHOLS,K. AT 2011 ON 11/20/2018 BY EVA Performed at [facility], [address] [place], [place] [address]    Culture PENDING  Incomplete   Report Status PENDING  Incomplete  MRSA PCR Screening     Status: None   Collection Time:  11/20/18 10:05 PM   Specimen: Nasal Mucosa; Nasopharyngeal  Result Value Ref Range Status   MRSA by PCR NEGATIVE NEGATIVE Final    Comment:        The GeneXpert MRSA Assay (FDA approved for NASAL specimens only), is one component of a comprehensive MRSA colonization surveillance program. It is not intended to diagnose MRSA infection nor to guide or monitor treatment for MRSA infections. Performed at [facility], [address], [place], [place] [address]     Radiology Reports Ct Head Wo Contrast  Result Date: 11/20/2018 CLINICAL DATA:  Per ED note. Pt brought in by ems after family called for possible seizure. Family reports she  drank a few white claws yesterday by the pool and got sick. Pt had multiple episodes of urinating on self with vomiting. Today had a possible seizure per ems and family gave cbd oil under tongue. EXAM: CT HEAD WITHOUT CONTRAST TECHNIQUE: Contiguous axial images were obtained from the base of the skull through the vertex without intravenous contrast. COMPARISON:  None. FINDINGS: Brain: No evidence of acute infarction, hemorrhage, hydrocephalus, extra-axial collection or mass lesion/mass effect. Vascular: No hyperdense vessel or unexpected calcification. Skull: Normal. Negative for fracture or focal lesion. Sinuses/Orbits: Globes and orbits are unremarkable. Mucous retention cyst in the left sphenoid sinus. Mild, right greater than left, polypoid mucosal thickening in the inferior maxillary sinuses. Clear mastoid air cells. Other: None. IMPRESSION: 1. No intracranial abnormality. 2. Sinus mucosal thickening as described. Electronically Signed   By: Amie Portlandavid  Ormond M.D.   On: 11/20/2018 17:57   Dg Chest Portable 1 View  Result Date: 11/20/2018 CLINICAL DATA:  Altered mental status. EXAM: PORTABLE CHEST 1 VIEW COMPARISON:  None. FINDINGS: Normal heart, mediastinum and hila. Clear lungs. No evidence of a pleural effusion. No Gross pneumothorax on this supine exam. Skeletal  structures are unremarkable. IMPRESSION: No active disease. Electronically Signed   By: Amie Portlandavid  Ormond M.D.   On: 11/20/2018 17:58    CBC Recent Labs  Lab 11/20/18 1658 11/21/18 0413  WBC 24.5* 13.9*  HGB 14.3 11.4*  HCT 42.0 34.3*  PLT 241 146*  MCV 88.4 90.3  MCH 30.1 30.0  MCHC 34.0 33.2  RDW 12.8 12.9  LYMPHSABS 0.2*  --   MONOABS 0.8  --   EOSABS 0.0  --   BASOSABS 0.0  --     Chemistries  Recent Labs  Lab 11/20/18 1658 11/20/18 1949 11/21/18 0413  NA 139  --  141  K 4.8  --  4.5  CL 110  --  116*  CO2 16*  --  17*  GLUCOSE 136*  --  127*  BUN 24*  --  31*  CREATININE 1.85*  --  3.30*  CALCIUM 9.6  --  8.3*  MG  --  2.8*  --   AST 63*  --  111*  ALT 29  --  34  ALKPHOS 55  --  40  BILITOT 1.1  --  1.3*   ------------------------------------------------------------------------------------------------------------------ No results for input(s): CHOL, HDL, LDLCALC, TRIG, CHOLHDL, LDLDIRECT in the last 72 hours.  No results found for: HGBA1C ------------------------------------------------------------------------------------------------------------------ No results for input(s): TSH, T4TOTAL, T3FREE, THYROIDAB in the last 72 hours.  Invalid input(s): FREET3 ------------------------------------------------------------------------------------------------------------------ No results for input(s): VITAMINB12, FOLATE, FERRITIN, TIBC, IRON, RETICCTPCT in the last 72 hours.  Coagulation profile No results for input(s): INR, PROTIME in the last 168 hours.  No results for input(s): DDIMER in the last 72 hours.  Cardiac Enzymes No results for input(s): CKMB, TROPONINI, MYOGLOBIN in the last 168 hours.  Invalid input(s): CK ------------------------------------------------------------------------------------------------------------------ No results found for: BNP   Shon Haleourage Briellah Baik M.D on 11/21/2018 at 2:16 PM  Go to [email or web address] - for contact info  [facility] - Office  [phone]

## 2018-11-21 NOTE — Progress Notes (Signed)
EEG completed, results pending. 

## 2018-11-21 NOTE — Progress Notes (Signed)
EEG at bedside.

## 2018-11-22 ENCOUNTER — Inpatient Hospital Stay (HOSPITAL_COMMUNITY): Payer: BC Managed Care – PPO

## 2018-11-22 DIAGNOSIS — R7989 Other specified abnormal findings of blood chemistry: Secondary | ICD-10-CM | POA: Diagnosis present

## 2018-11-22 LAB — CBC
HCT: 33 % — ABNORMAL LOW (ref 36.0–46.0)
Hemoglobin: 10.7 g/dL — ABNORMAL LOW (ref 12.0–15.0)
MCH: 29.6 pg (ref 26.0–34.0)
MCHC: 32.4 g/dL (ref 30.0–36.0)
MCV: 91.2 fL (ref 80.0–100.0)
Platelets: 128 10*3/uL — ABNORMAL LOW (ref 150–400)
RBC: 3.62 MIL/uL — ABNORMAL LOW (ref 3.87–5.11)
RDW: 12.6 % (ref 11.5–15.5)
WBC: 10.4 10*3/uL (ref 4.0–10.5)
nRBC: 0 % (ref 0.0–0.2)

## 2018-11-22 LAB — COMPREHENSIVE METABOLIC PANEL
ALT: 57 U/L — ABNORMAL HIGH (ref 0–44)
AST: 162 U/L — ABNORMAL HIGH (ref 15–41)
Albumin: 3.1 g/dL — ABNORMAL LOW (ref 3.5–5.0)
Alkaline Phosphatase: 38 U/L (ref 38–126)
Anion gap: 6 (ref 5–15)
BUN: 33 mg/dL — ABNORMAL HIGH (ref 6–20)
CO2: 21 mmol/L — ABNORMAL LOW (ref 22–32)
Calcium: 8.3 mg/dL — ABNORMAL LOW (ref 8.9–10.3)
Chloride: 114 mmol/L — ABNORMAL HIGH (ref 98–111)
Creatinine, Ser: 3.99 mg/dL — ABNORMAL HIGH (ref 0.44–1.00)
GFR calc Af Amer: 17 mL/min — ABNORMAL LOW (ref >60)
GFR calc non Af Amer: 15 mL/min — ABNORMAL LOW (ref >60)
Glucose, Bld: 111 mg/dL — ABNORMAL HIGH (ref 70–99)
Potassium: 3.7 mmol/L (ref 3.5–5.1)
Sodium: 141 mmol/L (ref 135–145)
Total Bilirubin: 1.3 mg/dL — ABNORMAL HIGH (ref 0.3–1.2)
Total Protein: 5.6 g/dL — ABNORMAL LOW (ref 6.5–8.1)

## 2018-11-22 LAB — URINE CULTURE: Culture: NO GROWTH

## 2018-11-22 LAB — DRUG PROFILE, UR, 9 DRUGS (LABCORP)

## 2018-11-22 LAB — URINALYSIS, ROUTINE W REFLEX MICROSCOPIC
Bilirubin Urine: NEGATIVE
Glucose, UA: NEGATIVE mg/dL
Ketones, ur: NEGATIVE mg/dL
Leukocytes,Ua: NEGATIVE
Nitrite: NEGATIVE
Protein, ur: NEGATIVE mg/dL
Specific Gravity, Urine: 1.006 (ref 1.005–1.030)
pH: 5 (ref 5.0–8.0)

## 2018-11-22 LAB — ACETAMINOPHEN LEVEL: Acetaminophen (Tylenol), Serum: <10 ug/mL — ABNORMAL LOW (ref 10–30)

## 2018-11-22 LAB — SALICYLATE LEVEL: Salicylate Lvl: <7 mg/dL (ref 2.8–30.0)

## 2018-11-22 LAB — GLUCOSE, CAPILLARY
Glucose-Capillary: 109 mg/dL — ABNORMAL HIGH (ref 70–99)
Glucose-Capillary: 115 mg/dL — ABNORMAL HIGH (ref 70–99)
Glucose-Capillary: 117 mg/dL — ABNORMAL HIGH (ref 70–99)
Glucose-Capillary: 129 mg/dL — ABNORMAL HIGH (ref 70–99)
Glucose-Capillary: 90 mg/dL (ref 70–99)

## 2018-11-22 LAB — NOVEL CORONAVIRUS, NAA (HOSP ORDER, SEND-OUT TO REF LAB; TAT 18-24 HRS): SARS-CoV-2, NAA: NOT DETECTED

## 2018-11-22 LAB — HIV ANTIBODY (ROUTINE TESTING W REFLEX): HIV Screen 4th Generation wRfx: NONREACTIVE

## 2018-11-22 LAB — CK: Total CK: 11990 U/L — ABNORMAL HIGH (ref 38–234)

## 2018-11-22 MED ORDER — SODIUM BICARBONATE 8.4 % IV SOLN
INTRAVENOUS | Status: DC
  Administered 2018-11-22 – 2018-11-24 (×2): via INTRAVENOUS
  Filled 2018-11-22 (×9): qty 50

## 2018-11-22 MED ORDER — SODIUM CHLORIDE 0.9 % IV SOLN
INTRAVENOUS | Status: DC
  Administered 2018-11-23: 12:00:00 via INTRAVENOUS

## 2018-11-22 MED ORDER — LEVETIRACETAM 500 MG PO TABS
500.0000 mg | ORAL_TABLET | Freq: Once | ORAL | Status: AC
  Administered 2018-11-22: 500 mg via ORAL
  Filled 2018-11-22: qty 1

## 2018-11-22 NOTE — Progress Notes (Signed)
Pt.  began to c/o pain to IV site after starting Keppra, swelling noted around site. 3 nurses attempted to get new site with no success throughout night. Notified Dr. Olevia Bowens to make aware and that pt did not get IV Keppra or fluids for several hrs. New order for po Keppra and to push fluids. Pitcher of water given to pt and advised of this. Awaiting ICU nurse to attempt IV. Will continue to monitor.

## 2018-11-22 NOTE — Consult Note (Signed)
Referring Provider: No ref. provider found Primary Care Physician:  Patient, No Pcp Per Primary Nephrologist:    Reason for Consultation: Acute kidney injury, rhabdomyolysis, treatment of electrolyte abnormalities, assessment of acid-base balance, maintenance of euvolemia.  HPI: This is a 23 year old lady that was admitted 11/20/2018 following a seizure episode she was brought to the emergency room.  She has a history of binge drinking alcohol with 4 white close.  Seizure-like activity noted by family history of CBD oil use.  Drug screen positive for cannabinoids.  Blood pressure 142/97 pulse 73 temperature 98.2 O2 sats 100% room air  Sodium 141 potassium 3.7 chloride 114 CO2 21 glucose 111 BUN 33 creatinine 3.99 calcium 8.3 albumin 3.1 AST is 162 ALT 57 bilirubin 1.3 CPK 11,990 WBC 10.4 hemoglobin 10.7 platelets 128  Creatinine trend admission 11/20/2018 1.8   11/21/2018 3.3    11/22/2018   3.99  CPK trend admission 11/20/2018 3600 11/21/2018 8000   11/22/2018 11,990  Current medications  IV sodium bicarbonate 50 mEq at 125 cc an hour  CT scan was unremarkable 11/20/2018  Urinalysis large blood protein 100 pH 5.0 no red blood cells seen   Other relevant labs   HIV screen negative coronavirus screen pending pregnancy screen negative lumbar puncture results CSF glucose 88 CSF protein 23 CSF culture negative CSF cell count WBC 1 RBC 2 clear color to few cells to count.    Past Medical History:  Diagnosis Date  . Medical history non-contributory     History reviewed. No pertinent surgical history.  Prior to Admission medications   Medication Sig Start Date End Date Taking? Authorizing Provider  Norethindrone-Ethinyl Estradiol-Fe Biphas (LO LOESTRIN FE) 1 MG-10 MCG / 10 MCG tablet Take 1 tablet by mouth daily.   Yes [provider]    Current Facility-Administered Medications  Medication Dose Route Frequency Provider Last Rate Last Dose  . acetaminophen (TYLENOL) tablet 650 mg   650 mg Oral Q6H PRN Reubin Milan, MD   650 mg at 11/22/18 0935   Or  . acetaminophen (TYLENOL) suppository 650 mg  650 mg Rectal Q6H PRN Reubin Milan, MD      . Chlorhexidine Gluconate Cloth 2 % PADS 6 each  6 each Topical Daily Reubin Milan, MD   6 each at 11/21/18 1007  . LORazepam (ATIVAN) injection 1 mg  1 mg Intravenous Q4H PRN Reubin Milan, MD      . prochlorperazine (COMPAZINE) injection 5 mg  5 mg Intravenous Q4H PRN Reubin Milan, MD      . sodium bicarbonate 50 mEq in dextrose 5 % 1,000 mL infusion   Intravenous Continuous Reubin Milan, MD 125 mL/hr at 11/22/18 0654      Allergies as of 11/20/2018  . (No Known Allergies)    History reviewed. No pertinent family history.  Social History   Socioeconomic History  . Marital status: Single    Spouse name: Not on file  . Number of children: Not on file  . Years of education: Not on file  . Highest education level: Not on file  Occupational History  . Not on file  Social Needs  . Financial resource strain: Not on file  . Food insecurity    Worry: Not on file    Inability: Not on file  . Transportation needs    Medical: Not on file    Non-medical: Not on file  Tobacco Use  . Smoking status: Current Every Day Smoker  Types: E-cigarettes  Substance and Sexual Activity  . Alcohol use: Yes  . Drug use: Not on file  . Sexual activity: Not on file  Lifestyle  . Physical activity    Days per week: Not on file    Minutes per session: Not on file  . Stress: Not on file  Relationships  . Social Musicianconnections    Talks on phone: Not on file    Gets together: Not on file    Attends religious service: Not on file    Active member of club or organization: Not on file    Attends meetings of clubs or organizations: Not on file    Relationship status: Not on file  . Intimate partner violence    Fear of current or ex partner: Not on file    Emotionally abused: Not on file    Physically  abused: Not on file    Forced sexual activity: Not on file  Other Topics Concern  . Not on file  Social History Narrative  . Not on file    Review of Systems: Gen: Denies any fatigue fever sweats chills HEENT: No visual complaints, No history of Retinopathy. Normal external appearance No Epistaxis or Sore throat. No sinusitis.   CV: Denies chest pain, angina, palpitations, syncope, orthopnea, PND, peripheral edema, and claudication. Resp: Denies dyspnea at rest, dyspnea with exercise, cough, sputum, wheezing, coughing up blood, and pleurisy. GI: Denies vomiting blood, jaundice, and fecal incontinence.   Denies dysphagia or odynophagia. GU : Denies urinary burning, blood in urine, urinary frequency, urinary hesitancy, nocturnal urination, and urinary incontinence.  No renal calculi. MS: Denies joint pain, limitation of movement, and swelling, stiffness, low back pain, extremity pain. Denies muscle weakness, cramps, atrophy.  No use of non steroidal antiinflammatory drugs. Derm: Denies rash, itching, dry skin, hives, moles, warts, or unhealing ulcers.  Psych: Denies depression, anxiety, memory loss, suicidal ideation, hallucinations, paranoia, and confusion. Heme: Denies bruising, bleeding, and enlarged lymph nodes. Neuro patient admitted with seizure spell following cannabis and alcohol ingestion Endocrine No DM.  No Thyroid disease.  No Adrenal disease.  Physical Exam: Vital signs in last 24 hours: Temp:  [98.5 F (36.9 C)-98.9 F (37.2 C)] 98.5 F (36.9 C) (06/16 0643) Pulse Rate:  [57-86] 73 (06/16 0643) Resp:  [14-26] 16 (06/16 0643) BP: (97-142)/(65-97) 142/97 (06/16 0643) SpO2:  [97 %-100 %] 100 % (06/16 0643) Last BM Date: 11/20/18 General:   Alert,  Well-developed, well-nourished, pleasant and cooperative in NAD Head:  Normocephalic and atraumatic. Eyes:  Sclera clear, no icterus.   Conjunctiva pink. Ears:  Normal auditory acuity. Nose:  No deformity, discharge,  or  lesions. Mouth:  No deformity or lesions, dentition normal. Neck:  Supple; no masses or thyromegaly. JVP not elevated Lungs:  Clear throughout to auscultation.   No wheezes, crackles, or rhonchi. No acute distress. Heart:  Regular rate and rhythm; no murmurs, clicks, rubs,  or gallops. Abdomen:  Soft, nontender and nondistended. No masses, hepatosplenomegaly or hernias noted. Normal bowel sounds, without guarding, and without rebound.   Msk:  Symmetrical without gross deformities. Normal posture. Pulses:  No carotid, renal, femoral bruits. DP and PT symmetrical and equal Extremities:  Without clubbing or edema. Neurologic:  Alert and  oriented x4;  grossly normal neurologically. Skin:  Intact without significant lesions or rashes. Cervical Nodes:  No significant cervical adenopathy. Psych:  Alert and cooperative. Normal mood and affect.  Intake/Output from previous day: 06/15 0701 - 06/16 0700 In: 100 [  IV Piggyback:100] Out: 900 [Urine:900] Intake/Output this shift: Total I/O In: 240 [P.O.:240] Out: -   Lab Results: Recent Labs    11/20/18 1658 11/21/18 0413 11/22/18 0443  WBC 24.5* 13.9* 10.4  HGB 14.3 11.4* 10.7*  HCT 42.0 34.3* 33.0*  PLT 241 146* 128*   BMET Recent Labs    11/20/18 1658 11/21/18 0413 11/22/18 0443  NA 139 141 141  K 4.8 4.5 3.7  CL 110 116* 114*  CO2 16* 17* 21*  GLUCOSE 136* 127* 111*  BUN 24* 31* 33*  CREATININE 1.85* 3.30* 3.99*  CALCIUM 9.6 8.3* 8.3*   LFT Recent Labs    11/22/18 0443  PROT 5.6*  ALBUMIN 3.1*  AST 162*  ALT 57*  ALKPHOS 38  BILITOT 1.3*   PT/INR No results for input(s): LABPROT, INR in the last 72 hours. Hepatitis Panel No results for input(s): HEPBSAG, HCVAB, HEPAIGM, HEPBIGM in the last 72 hours.  Studies/Results: Ct Head Wo Contrast  Result Date: 11/20/2018 CLINICAL DATA:  Per ED note. Pt brought in by ems after family called for possible seizure. Family reports she drank a few white claws yesterday by  the pool and got sick. Pt had multiple episodes of urinating on self with vomiting. Today had a possible seizure per ems and family gave cbd oil under tongue. EXAM: CT HEAD WITHOUT CONTRAST TECHNIQUE: Contiguous axial images were obtained from the base of the skull through the vertex without intravenous contrast. COMPARISON:  None. FINDINGS: Brain: No evidence of acute infarction, hemorrhage, hydrocephalus, extra-axial collection or mass lesion/mass effect. Vascular: No hyperdense vessel or unexpected calcification. Skull: Normal. Negative for fracture or focal lesion. Sinuses/Orbits: Globes and orbits are unremarkable. Mucous retention cyst in the left sphenoid sinus. Mild, right greater than left, polypoid mucosal thickening in the inferior maxillary sinuses. Clear mastoid air cells. Other: None. IMPRESSION: 1. No intracranial abnormality. 2. Sinus mucosal thickening as described. Electronically Signed   By: Amie Portlandavid  Ormond M.D.   On: 11/20/2018 17:57   Dg Chest Portable 1 View  Result Date: 11/20/2018 CLINICAL DATA:  Altered mental status. EXAM: PORTABLE CHEST 1 VIEW COMPARISON:  None. FINDINGS: Normal heart, mediastinum and hila. Clear lungs. No evidence of a pleural effusion. No gross pneumothorax on this supine exam. Skeletal structures are unremarkable. IMPRESSION: No active disease. Electronically Signed   By: Amie Portlandavid  Ormond M.D.   On: 11/20/2018 17:58    Assessment/Plan:  Acute kidney injury this appears to be secondary to rhabdomyolysis with the classic findings of dipstick positive blood but no RBCs seen per high-powered film.  She appears to be hemodynamically stable although blood pressures on admission were a little lower with a systolic blood pressure of less than 100 mmHg.  Renal ultrasound is pending this morning.  There is nothing to suggest acute interstitial nephritis or acute glomerulonephritis per history although there was noted to be 6-10 WBCs per high-powered film.  I will recheck  urinalysis.  She does not appear to be on any medications at this particular time.  Metabolic acidosis.  Patient has been treated with IV bicarbonate I think we can discontinue the IV bicarbonate at this time she appears to be eating and drinking well I do not see there is no indication for IV fluids at this time.  Anemia does not appear to be an issue  Substance abuse would suggest counseling and cessation of cannabis and binge drinking of alcohol.  Would recommend that she enters a 10 step program for cessation.  LOS: 2 Garnetta BuddyMartin W Tamy Accardo @TODAY @10 :01 AM

## 2018-11-22 NOTE — Plan of Care (Signed)

## 2018-11-22 NOTE — Progress Notes (Signed)
Patient Demographics:    Carla Gross, is a 23 y.o. female, DOB - [birth year], URK:[id number]  Admit date - 11/20/2018   Admitting Physician Reubin Milan, MD  Outpatient Primary MD for the patient is Patient, No Pcp Per  LOS - 2   Chief Complaint  Patient presents with  . Altered Mental Status        Subjective:    Carla Gross today has no fevers, no emesis,   patient is awake, coherent, , no further seizures, she does not recall events of the weekend, with patient's permission I attempted to call patient's father left couple of HIPAA compliant messages at 2 different phone numbers-- [phone] and [phone]--  Patient is eating and drinking well voiding well, no fevers no chills  No Nausea, Vomiting or Diarrhea   Assessment  & Plan :    Principal Problem:   Acute Toxic encephalopathy Active Problems:   Seizure (HCC)   Rhabdomyolysis   AKI (acute kidney injury) ([place])   Leukocytosis   Marijuana abuse, continuous  Brief Summary 23 year old without significant past medical history and specifically no prior history of seizures admitted on 11/20/2018 with concerns about new onset seizures in the setting of THC  and alcohol use   A/p 1)Acute Toxic Encephalopathy----secondary to "substance ingestion" compounded by postictal state, CT head without acute findings, UA is not suggestive of UTI, and chest x-ray without pneumonia, lactic acid is down from 2.3 to 1.7 post hydration... Lumbar puncture noted, CSF studies not consistent with infectious process, CSF Gram stain with no organisms WBC, .Marland Kitchen Afebrile, WBC is 10.4, HSV CSF study pending, blood, urine and CSF  Cultures are all NGTD... Patient denies suicidal ideation or plan, patient denies drug overdose, acetaminophen and salicylate levels added  2)Seizures--- as per patient no previous personal or family history of seizures in first-degree  relatives , suspect drug-induced seizures patient has been using excessive amounts of concentrated THC compounds as well as alcohol--- EEG from 11/21/2018 without epileptiform findings.  CT head without acute findings.  No further need for Keppra at this time unless patient has another seizure episode--- patient will need outpatient follow-up with Dr.Kofi Doonquah (Dr Merlene Laughter) is currently not available this week for inpatient consults --- Upon discharge patient will need to please make an appointment for follow-up visit with neurologist Dr. Phillips Odor in 1 to 2 weeks--please call neurology office at [phone] to make appointment address: [address], [place], [place] [address] Phone: [phone] --- ---- patient was advised that as Per [facility] statutes, patients with seizures are not allowed to drive until they have been seizure-free for six months--neurologist after outpatient evaluation may clear patient for driving  Patient is advised to use caution when using heavy equipment or power tools. Avoid working on ladders or at heights. Take showers instead of baths, Do not lock yourself in a room alone (i.e. bathroom).. Ensure the water temperature is not too high on the home water heater. Do not go swimming alone. When caring for infants or small children, sit down when holding, feeding, or changing them to minimize risk of injury to the child in the event you have a seizure.   Do not lock yourself in a room alone (i.e.  bathroom).  Maintain good sleep hygiene. Avoid alcohol.    If patient has another seizure, call 911 and bring them back to the ED if: A.  The seizure lasts longer than 5 minutes.      B.  The patient doesn't wake shortly after the seizure or has new problems such as difficulty seeing, speaking or moving following the seizure C.  The patient was injured during the seizure D.  The patient has a temperature over 102 F (39C) E.  The patient vomited during the  seizure and now is having trouble breathing   3)Rhabdomyolysis  --- worsening, CK is trending up up to 11,990 (peak) , suspect due to seizure related muscle injury, continue to hydrate IV and.p.o.----   given that total CK and creatinine are trending up patient will continue to require hydration with IV fluids to avoid further renal compromise  4)Acute kidney injury with non-anion gap metabolic acidosis---- AKI is due to  rhabdomyolysis with the classic findings of dipstick positive blood but no RBCs seen per high-powered film. Renal function is worsening, creatinine trending up  3.99 (new Peak) , it was 1.8 on admission, no recent renal function available for comparison, suspect patient had normal renal function previously, avoid hypotension, avoid nephrotoxic agents--abdominal ultrasound without obstructive uropathy--kidneys with normal echogenicity  5)Leukocytosis--suspect this is reactive in the setting of seizures, WBC is down to 10.4 from 24.5, patient without fevers, chest x-ray without acute findings, UA not suggestive of UTI, CSF studies not consistent with meningitis, blood, urine and CSF cultures NGTD cultures pending   6)Elevated LFTs--- AST is up to 162 from 63 on admission, ALT is up to 57 from 29 on admission, T bili is 1.3, alk phos is 38----patient admits to heavy alcohol use, suspect some degree of alcoholic hepatitis given AST : ALT ratio, liver and gallbladder ultrasound without acute findings----avoid hepatotoxic agents....    Disposition/Need for in-Hospital Stay- patient unable to be discharged at this time due to rhabdomyolysis and acute kidney injury requiring IV fluids and bicarb infusion  Code Status : Full  Family Communication:    Spoke with patient's boyfriend/significant other at patient request with pt present in hospital bed on 11/21/2018   On 11/22/2018 with patient's permission I attempted to call patient's father x 2 -- I left couple of HIPAA compliant messages at  2 different phone numbers-- [phone] and [phone]-- .Marland KitchenMarland Kitchen Notified patient that I was unable to reach her father by phone, patient is alert, coherent and she will update her father herself.    Disposition Plan  : To be determined  Consults  :   ---Nephrology--- ---patient will need outpatient neurology follow-up  DVT Prophylaxis  :    Heparin - SCDs  Lab Results  Component Value Date   PLT 128 (L) 11/22/2018    Inpatient Medications  Scheduled Meds: . Chlorhexidine Gluconate Cloth  6 each Topical Daily   Continuous Infusions: . sodium chloride    .  sodium bicarbonate  infusion 1000 mL 125 mL/hr at 11/22/18 1626   PRN Meds:.acetaminophen **OR** acetaminophen, LORazepam, prochlorperazine    Anti-infectives (From admission, onward)   None       Objective:   Vitals:   11/21/18 2205 11/22/18 0200 11/22/18 0643 11/22/18 1356  BP: 107/77 114/85 (!) 142/97 134/87  Pulse: 85 80 73 (!) 57  Resp: _0 Temp: 98.7 F (37.1 C) 98.9 F (37.2 C) 98.5 F (36.9 C) 98.4 F (36.9 C)  TempSrc: Oral  Oral Oral Oral  SpO2: 97% 100% 100% 100%  Weight:      Height:        Wt Readings from Last 3 Encounters:  11/21/18 72.2 kg     Intake/Output Summary (Last 24 hours) at 11/22/2018 1627 Last data filed at 11/22/2018 1300 Gross per 24 hour  Intake 480 ml  Output 900 ml  Net -420 ml    Physical Exam  Gen:- Awake Alert, in no apparent distress , speaking in complete sentences HEENT:- [place].AT, No sclera icterus Mouth----healing bite marks/injury on patient's tongue Neck-Supple Neck,No JVD,.  Lungs-  CTAB , fair symmetrical air movement CV- S1, S2 normal, regular  Abd-  +ve B.Sounds, Abd Soft, No tenderness,    Extremity/Skin:- No  edema, pedal pulses present  Psych-affect is appropriate, oriented x3--- patient is completely awake and coherent, verbalizes understanding, Neuro- no new focal deficits, no tremors   Data Review:   Micro Results Recent Results  (from the past 240 hour(s))  Blood culture (routine x 2)     Status: None (Preliminary result)   Collection Time: 11/20/18  6:00 PM   Specimen: BLOOD LEFT ARM  Result Value Ref Range Status   Specimen Description BLOOD LEFT ARM  Final   Special Requests   Final    BOTTLES DRAWN AEROBIC AND ANAEROBIC Blood Culture adequate volume   Culture   Final    NO GROWTH 2 DAYS Performed at [facility], [address] [place], [place] [address]    Report Status PENDING  Incomplete  Urine culture     Status: None   Collection Time: 11/20/18  6:10 PM   Specimen: Urine, Catheterized  Result Value Ref Range Status   Specimen Description   Final    URINE, CATHETERIZED Performed at [facility], [address], [place], [place] [address]    Special Requests   Final    NONE Performed at [facility], [address] [place], [place] [address]    Culture   Final    NO GROWTH Performed at [facility], [place] [address] [place], [place] [address]    Report Status 11/22/2018 FINAL  Final  Blood culture (routine x 2)     Status: None (Preliminary result)   Collection Time: 11/20/18  6:17 PM   Specimen: BLOOD RIGHT HAND  Result Value Ref Range Status   Specimen Description BLOOD RIGHT HAND  Final   Special Requests   Final    BOTTLES DRAWN AEROBIC AND ANAEROBIC Blood Culture adequate volume   Culture   Final    NO GROWTH 2 DAYS Performed at [facility], [address], [place], [place] [address]    Report Status PENDING  Incomplete  Novel Coronavirus,NAA,(SEND-OUT TO REF LAB - TAT 24-48 hrs); Hosp Order     Status: None   Collection Time: 11/20/18  7:13 PM   Specimen: Oropharyngeal swab; Respiratory  Result Value Ref Range Status   SARS-CoV-2, NAA NOT DETECTED NOT DETECTED Final    Comment: (NOTE) This test was developed and its performance characteristics determined by [employer]. This test has not been FDA cleared or approved. This test has been authorized by FDA under an  Emergency Use Authorization (EUA). This test is only authorized for the duration of time the declaration that circumstances exist justifying the authorization of the emergency use of in vitro diagnostic tests for detection of SARS-CoV-2 virus and/or diagnosis of COVID-19 infection under section 564(b)(1) of the Act, 21 U.S.C. 268TMH-9(Q)(2), unless  the authorization is terminated or revoked sooner. When diagnostic testing is negative, the possibility of a false negative result should be considered in the context of a patient's recent exposures and the presence of clinical signs and symptoms consistent with COVID-19. An individual without symptoms of COVID-19 and who is not shedding SARS-CoV-2 virus would expect to have a negative (not detected) result in this assay. Performed  At: [facility] [address] [place], [place] [id number] Rush Farmer MD NF:[id number]    [place]  Final    Comment: Performed at [facility], [address], [place], [place] [address]  CSF culture     Status: None (Preliminary result)   Collection Time: 11/20/18  7:13 PM   Specimen: CSF; Cerebrospinal Fluid  Result Value Ref Range Status   Specimen Description   Final    CSF Performed at [facility], [address] [place], [place] [address]    Special Requests   Final    NONE Performed at [facility], [address], [place], [place] [address]    Gram Stain   Final    NO CELLS OR ORGANISMS SEEN Gram Stain Report Called to,Read Back By and Verified With: NICHOLS,K. AT 2011 ON 11/20/2018 BY EVA Performed at [facility], [address] [place], [place] [address]    Culture   Final    NO GROWTH 1 DAY Performed at [facility], [place] [address], [place], [place] [address]    Report Status PENDING  Incomplete  MRSA PCR Screening     Status: None   Collection Time: 11/20/18 10:05 PM   Specimen: Nasal Mucosa; Nasopharyngeal  Result Value Ref Range Status    MRSA by PCR NEGATIVE NEGATIVE Final    Comment:        The GeneXpert MRSA Assay (FDA approved for NASAL specimens only), is one component of a comprehensive MRSA colonization surveillance program. It is not intended to diagnose MRSA infection nor to guide or monitor treatment for MRSA infections. Performed at [facility], [address], [place], [place] [address]     Radiology Reports Ct Head Wo Contrast  Result Date: 11/20/2018 CLINICAL DATA:  Per ED note. Pt brought in by ems after family called for possible seizure. Family reports she drank a few white claws yesterday by the pool and got sick. Pt had multiple episodes of urinating on self with vomiting. Today had a possible seizure per ems and family gave cbd oil under tongue. EXAM: CT HEAD WITHOUT CONTRAST TECHNIQUE: Contiguous axial images were obtained from the base of the skull through the vertex without intravenous contrast. COMPARISON:  None. FINDINGS: Brain: No evidence of acute infarction, hemorrhage, hydrocephalus, extra-axial collection or mass lesion/mass effect. Vascular: No hyperdense vessel or unexpected calcification. Skull: Normal. Negative for fracture or focal lesion. Sinuses/Orbits: Globes and orbits are unremarkable. Mucous retention cyst in the left sphenoid sinus. Mild, right greater than left, polypoid mucosal thickening in the inferior maxillary sinuses. Clear mastoid air cells. Other: None. IMPRESSION: 1. No intracranial abnormality. 2. Sinus mucosal thickening as described. Electronically Signed   By: Lajean Manes M.D.   On: 11/20/2018 17:57   US Abdomen Complete  Result Date: 11/22/2018 CLINICAL DATA:  23 year old female with a history of increased creatinine and liver failure EXAM: ABDOMEN ULTRASOUND COMPLETE COMPARISON:  None. FINDINGS: Gallbladder: No gallstones or wall thickening visualized. No sonographic Murphy sign noted by sonographer. Common bile duct: Diameter: 2 mm-3 mm Liver: There is a small  hyperechoic  rounded focus within the right liver measuring 6 mm. Unremarkable echotexture of the liver. IVC: No abnormality visualized. Pancreas: Visualized portion unremarkable. Spleen: Size and appearance within normal limits. Right Kidney: Length: 10.8 cm. Echogenicity within normal limits. No mass or hydronephrosis visualized. Fluid versus adrenal gland at the superior cortex of the right kidney. Left Kidney: Length: 11.1 cm. Echogenicity within normal limits. No mass or hydronephrosis visualized. Abdominal aorta: No aneurysm visualized. Other findings: None. IMPRESSION: Sonographic survey of the abdomen demonstrates no acute finding. No hydronephrosis. There is a 6 mm hyperechoic focus of the right liver which most likely represents a hemangioma in a patient of this age. Electronically Signed   By: Corrie Mckusick D.O.   On: 11/22/2018 13:20   Dg Chest Portable 1 View  Result Date: 11/20/2018 CLINICAL DATA:  Altered mental status. EXAM: PORTABLE CHEST 1 VIEW COMPARISON:  None. FINDINGS: Normal heart, mediastinum and hila. Clear lungs. No evidence of a pleural effusion. No gross pneumothorax on this supine exam. Skeletal structures are unremarkable. IMPRESSION: No active disease. Electronically Signed   By: Lajean Manes M.D.   On: 11/20/2018 17:58    CBC Recent Labs  Lab 11/20/18 1658 11/21/18 0413 11/22/18 0443  WBC 24.5* 13.9* 10.4  HGB 14.3 11.4* 10.7*  HCT 42.0 34.3* 33.0*  PLT 241 146* 128*  MCV 88.4 90.3 91.2  MCH 30.1 30.0 29.6  MCHC 34.0 33.2 32.4  RDW 12.8 12.9 12.6  LYMPHSABS 0.2*  --   --   MONOABS 0.8  --   --   EOSABS 0.0  --   --   BASOSABS 0.0  --   --     Chemistries  Recent Labs  Lab 11/20/18 1658 11/20/18 1949 11/21/18 0413 11/22/18 0443  NA 139  --  141 141  K 4.8  --  4.5 3.7  CL 110  --  116* 114*  CO2 16*  --  17* 21*  GLUCOSE 136*  --  127* 111*  BUN 24*  --  31* 33*  CREATININE 1.85*  --  3.30* 3.99*  CALCIUM 9.6  --  8.3* 8.3*  MG  --  2.8*  --    --   AST 63*  --  111* 162*  ALT 29  --  34 57*  ALKPHOS 55  --  40 38  BILITOT 1.1  --  1.3* 1.3*   ------------------------------------------------------------------------------------------------------------------ No results for input(s): CHOL, HDL, LDLCALC, TRIG, CHOLHDL, LDLDIRECT in the last 72 hours.  No results found for: HGBA1C ------------------------------------------------------------------------------------------------------------------ No results for input(s): TSH, T4TOTAL, T3FREE, THYROIDAB in the last 72 hours.  Invalid input(s): FREET3 ------------------------------------------------------------------------------------------------------------------ No results for input(s): VITAMINB12, FOLATE, FERRITIN, TIBC, IRON, RETICCTPCT in the last 72 hours.  Coagulation profile No results for input(s): INR, PROTIME in the last 168 hours.  No results for input(s): DDIMER in the last 72 hours.  Cardiac Enzymes No results for input(s): CKMB, TROPONINI, MYOGLOBIN in the last 168 hours.  Invalid input(s): CK ------------------------------------------------------------------------------------------------------------------ No results found for: BNP   Roxan Hockey M.D on 11/22/2018 at 4:27 PM  Go to [email or web address] - for contact info  [facility] - Office  [phone]

## 2018-11-23 ENCOUNTER — Encounter (HOSPITAL_COMMUNITY): Payer: Self-pay

## 2018-11-23 ENCOUNTER — Inpatient Hospital Stay (HOSPITAL_COMMUNITY): Payer: BC Managed Care – PPO

## 2018-11-23 DIAGNOSIS — M6282 Rhabdomyolysis: Secondary | ICD-10-CM

## 2018-11-23 DIAGNOSIS — R945 Abnormal results of liver function studies: Secondary | ICD-10-CM

## 2018-11-23 DIAGNOSIS — F1210 Cannabis abuse, uncomplicated: Secondary | ICD-10-CM

## 2018-11-23 DIAGNOSIS — R569 Unspecified convulsions: Secondary | ICD-10-CM

## 2018-11-23 DIAGNOSIS — N179 Acute kidney failure, unspecified: Secondary | ICD-10-CM

## 2018-11-23 LAB — CBC
HCT: 33.5 % — ABNORMAL LOW (ref 36.0–46.0)
Hemoglobin: 11.2 g/dL — ABNORMAL LOW (ref 12.0–15.0)
MCH: 29.5 pg (ref 26.0–34.0)
MCHC: 33.4 g/dL (ref 30.0–36.0)
MCV: 88.2 fL (ref 80.0–100.0)
Platelets: 171 10*3/uL (ref 150–400)
RBC: 3.8 MIL/uL — ABNORMAL LOW (ref 3.87–5.11)
RDW: 12.3 % (ref 11.5–15.5)
WBC: 7.1 10*3/uL (ref 4.0–10.5)
nRBC: 0 % (ref 0.0–0.2)

## 2018-11-23 LAB — GLUCOSE, CAPILLARY
Glucose-Capillary: 105 mg/dL — ABNORMAL HIGH (ref 70–99)
Glucose-Capillary: 106 mg/dL — ABNORMAL HIGH (ref 70–99)
Glucose-Capillary: 99 mg/dL (ref 70–99)

## 2018-11-23 LAB — COMPREHENSIVE METABOLIC PANEL
ALT: 105 U/L — ABNORMAL HIGH (ref 0–44)
AST: 256 U/L — ABNORMAL HIGH (ref 15–41)
Albumin: 3.2 g/dL — ABNORMAL LOW (ref 3.5–5.0)
Alkaline Phosphatase: 41 U/L (ref 38–126)
Anion gap: 8 (ref 5–15)
BUN: 24 mg/dL — ABNORMAL HIGH (ref 6–20)
CO2: 24 mmol/L (ref 22–32)
Calcium: 8.4 mg/dL — ABNORMAL LOW (ref 8.9–10.3)
Chloride: 109 mmol/L (ref 98–111)
Creatinine, Ser: 2.88 mg/dL — ABNORMAL HIGH (ref 0.44–1.00)
GFR calc Af Amer: 26 mL/min — ABNORMAL LOW (ref >60)
GFR calc non Af Amer: 22 mL/min — ABNORMAL LOW (ref >60)
Glucose, Bld: 114 mg/dL — ABNORMAL HIGH (ref 70–99)
Potassium: 3.5 mmol/L (ref 3.5–5.1)
Sodium: 141 mmol/L (ref 135–145)
Total Bilirubin: 1.2 mg/dL (ref 0.3–1.2)
Total Protein: 6 g/dL — ABNORMAL LOW (ref 6.5–8.1)

## 2018-11-23 LAB — HSV DNA BY PCR (REFERENCE LAB)
HSV 1 DNA: NEGATIVE
HSV 2 DNA: NEGATIVE

## 2018-11-23 LAB — MISC LABCORP TEST (SEND OUT): Labcorp test code: 71159

## 2018-11-23 LAB — CK: Total CK: 26928 U/L — ABNORMAL HIGH (ref 38–234)

## 2018-11-23 MED ORDER — LIDOCAINE VISCOUS HCL 2 % MT SOLN
15.0000 mL | Freq: Four times a day (QID) | OROMUCOSAL | Status: DC | PRN
  Administered 2018-11-23 – 2018-11-26 (×5): 15 mL via OROMUCOSAL
  Filled 2018-11-23 (×5): qty 15

## 2018-11-23 MED ORDER — MAGIC MOUTHWASH
10.0000 mL | Freq: Four times a day (QID) | ORAL | Status: DC | PRN
  Administered 2018-11-23: 10 mL via ORAL
  Filled 2018-11-23: qty 10

## 2018-11-23 MED ORDER — LEVETIRACETAM 500 MG PO TABS
500.0000 mg | ORAL_TABLET | Freq: Two times a day (BID) | ORAL | Status: DC
  Administered 2018-11-23 – 2018-11-26 (×7): 500 mg via ORAL
  Filled 2018-11-23 (×7): qty 1

## 2018-11-23 NOTE — Progress Notes (Signed)
MRI unable to be performed on patient due to multiple piercing. Patient states she is unable to remove piercing at this time. MD has been informed.

## 2018-11-23 NOTE — Progress Notes (Signed)
[facility] ROUNDING NOTE   Subjective:   This is a pleasant 23 year old lady that was admitted 11/20/2018 following a seizure episode brought to the emergency room she been drinking binge drinking alcohol and using CBD oil.  She has developed acute kidney injury with an elevated CPK of nearly 12,000.  Blood pressure 143/91 pulse 84 temperature 99 O2 sats 99% room air urine output I do not see any recorded.  Sodium 141 potassium 3.5 chloride 109 CO2 24 BUN 24 creatinine 2.88 glucose 114 calcium 8.4 Albumin 3.2 AST 256 ALT 105 hemoglobin 11.2   acetaminophen and salicylate level undetectable  Urinalysis large blood 0 RBCs per high-powered from negative for proteinuria   Ultrasound of liver showed a 6 mm hyperechoic focus most likely representing hemangioma.  Renal size was normal with no hydronephrosis visualized    Objective:  Vital signs in last 24 hours:  Temp:  [98.2 F (36.8 C)-99 F (37.2 C)] 99 F (37.2 C) (06/17 0906) Pulse Rate:  [57-84] 84 (06/17 0906) Resp:  [14-18] 16 (06/17 0906) BP: (128-149)/(87-99) 128/89 (06/17 0906) SpO2:  [99 %-100 %] 99 % (06/17 0906) Weight:  [75.8 kg] 75.8 kg (06/17 0508)  Weight change:  Filed Weights   11/20/18 2206 11/21/18 0500 11/23/18 0508  Weight: 72.2 kg 72.2 kg 75.8 kg    Intake/Output: I/O last 3 completed shifts: In: 1720 [P.O.:720; I.V.:1000] Out: -    Intake/Output this shift:  No intake/output data recorded.   Alert pleasant nondistressed CVS- RRR JVP not elevated  RS- CTA no wheezes or rales ABD- BS present soft non-distended EXT- no edema   Basic Metabolic Panel: Recent Labs  Lab 11/20/18 1658 11/20/18 1949 11/21/18 0413 11/22/18 0443 11/23/18 0514  NA 139  --  141 141 141  K 4.8  --  4.5 3.7 3.5  CL 110  --  116* 114* 109  CO2 16*  --  17* 21* 24  GLUCOSE 136*  --  127* 111* 114*  BUN 24*  --  31* 33* 24*  CREATININE 1.85*  --  3.30* 3.99* 2.88*  CALCIUM 9.6  --  8.3* 8.3* 8.4*  MG   --  2.8*  --   --   --     Liver Function Tests: Recent Labs  Lab 11/20/18 1658 11/21/18 0413 11/22/18 0443 11/23/18 0514  AST 63* 111* 162* 256*  ALT 29 34 57* 105*  ALKPHOS 55 40 38 41  BILITOT 1.1 1.3* 1.3* 1.2  PROT 7.8 6.0* 5.6* 6.0*  ALBUMIN 4.5 3.4* 3.1* 3.2*   No results for input(s): LIPASE, AMYLASE in the last 168 hours. No results for input(s): AMMONIA in the last 168 hours.  CBC: Recent Labs  Lab 11/20/18 1658 11/21/18 0413 11/22/18 0443 11/23/18 0514  WBC 24.5* 13.9* 10.4 7.1  NEUTROABS 22.5*  --   --   --   HGB 14.3 11.4* 10.7* 11.2*  HCT 42.0 34.3* 33.0* 33.5*  MCV 88.4 90.3 91.2 88.2  PLT 241 146* 128* 171    Cardiac Enzymes: Recent Labs  Lab 11/20/18 1949 11/21/18 0413 11/22/18 0443 11/23/18 0514  CKTOTAL 3,642* 8,074* 11,990* 26,928*    BNP: Invalid input(s): POCBNP  CBG: Recent Labs  Lab 11/22/18 0642 11/22/18 1145 11/22/18 1811 11/22/18 2348 11/23/18 0509  GLUCAP 109* 90 129* 117* 105*    Microbiology: Results for orders placed or performed during the hospital encounter of 11/20/18  Blood culture (routine x 2)     Status: None (Preliminary result)  Collection Time: 11/20/18  6:00 PM   Specimen: BLOOD LEFT ARM  Result Value Ref Range Status   Specimen Description BLOOD LEFT ARM  Final   Special Requests   Final    BOTTLES DRAWN AEROBIC AND ANAEROBIC Blood Culture adequate volume   Culture   Final    NO GROWTH 3 DAYS Performed at [facility], [address], [place], [place] [address]    Report Status PENDING  Incomplete  Urine culture     Status: None   Collection Time: 11/20/18  6:10 PM   Specimen: Urine, Catheterized  Result Value Ref Range Status   Specimen Description   Final    URINE, CATHETERIZED Performed at [facility], [address] [place], [place] [address]    Special Requests   Final    NONE Performed at [facility], [address], [place], [place] [address]    Culture   Final    NO  GROWTH Performed at [facility], [place] [address], [place], [place] [address]    Report Status 11/22/2018 FINAL  Final  Blood culture (routine x 2)     Status: None (Preliminary result)   Collection Time: 11/20/18  6:17 PM   Specimen: BLOOD RIGHT HAND  Result Value Ref Range Status   Specimen Description BLOOD RIGHT HAND  Final   Special Requests   Final    BOTTLES DRAWN AEROBIC AND ANAEROBIC Blood Culture adequate volume   Culture   Final    NO GROWTH 3 DAYS Performed at [facility], [address] [place], [place] [address]    Report Status PENDING  Incomplete  Novel Coronavirus,NAA,(SEND-OUT TO REF LAB - TAT 24-48 hrs); Hosp Order     Status: None   Collection Time: 11/20/18  7:13 PM   Specimen: Oropharyngeal swab; Respiratory  Result Value Ref Range Status   SARS-CoV-2, NAA NOT DETECTED NOT DETECTED Final    Comment: (NOTE) This test was developed and its performance characteristics determined by [employer]. This test has not been FDA cleared or approved. This test has been authorized by FDA under an Emergency Use Authorization (EUA). This test is only authorized for the duration of time the declaration that circumstances exist justifying the authorization of the emergency use of in vitro diagnostic tests for detection of SARS-CoV-2 virus and/or diagnosis of COVID-19 infection under section 564(b)(1) of the Act, 21 U.S.C. 149FWY-6(V)(7), unless the authorization is terminated or revoked sooner. When diagnostic testing is negative, the possibility of a false negative result should be considered in the context of a patient's recent exposures and the presence of clinical signs and symptoms consistent with COVID-19. An individual without symptoms of COVID-19 and who is not shedding SARS-CoV-2 virus would expect to have a negative (not detected) result in this assay. Performed  At: [facility] [address] [place], [place] [id number] Rush Farmer MD  AJ:[id number]    [place]  Final    Comment: Performed at [facility], [address], [place], [place] [address]  CSF culture     Status: None (Preliminary result)   Collection Time: 11/20/18  7:13 PM   Specimen: CSF; Cerebrospinal Fluid  Result Value Ref Range Status   Specimen Description   Final    CSF Performed at [facility], [address] [place], [place] [address]    Special Requests   Final    NONE Performed at [facility], [address], [place], [place] [address]    Gram  Stain   Final    NO CELLS OR ORGANISMS SEEN Gram Stain Report Called to,Read Back By and Verified With: NICHOLS,K. AT 2011 ON 11/20/2018 BY EVA Performed at [facility], [address] [place], [place] [address]    Culture   Final    NO GROWTH 2 DAYS Performed at [facility], [address] [place], [place] [address]    Report Status PENDING  Incomplete  MRSA PCR Screening     Status: None   Collection Time: 11/20/18 10:05 PM   Specimen: Nasal Mucosa; Nasopharyngeal  Result Value Ref Range Status   MRSA by PCR NEGATIVE NEGATIVE Final    Comment:        The GeneXpert MRSA Assay (FDA approved for NASAL specimens only), is one component of a comprehensive MRSA colonization surveillance program. It is not intended to diagnose MRSA infection nor to guide or monitor treatment for MRSA infections. Performed at [facility], [address] [place], [place] [address]     Coagulation Studies: No results for input(s): LABPROT, INR in the last 72 hours.  Urinalysis: Recent Labs    11/20/18 1810 11/22/18 1021  COLORURINE YELLOW STRAW*  LABSPEC 1.012 1.006  PHURINE 5.0 5.0  GLUCOSEU 50* NEGATIVE  HGBUR LARGE* LARGE*  BILIRUBINUR NEGATIVE NEGATIVE  KETONESUR 5* NEGATIVE  PROTEINUR 100* NEGATIVE  NITRITE NEGATIVE NEGATIVE  LEUKOCYTESUR NEGATIVE NEGATIVE      Imaging: [place] Abdomen Complete  Result Date: 11/22/2018 CLINICAL DATA:  23 year old female with  a history of increased creatinine and liver failure EXAM: ABDOMEN ULTRASOUND COMPLETE COMPARISON:  None. FINDINGS: Gallbladder: No gallstones or wall thickening visualized. No sonographic Murphy sign noted by sonographer. Common bile duct: Diameter: 2 mm-3 mm Liver: There is a small hyperechoic rounded focus within the right liver measuring 6 mm. Unremarkable echotexture of the liver. IVC: No abnormality visualized. Pancreas: Visualized portion unremarkable. Spleen: Size and appearance within normal limits. Right Kidney: Length: 10.8 cm. Echogenicity within normal limits. No mass or hydronephrosis visualized. Fluid versus adrenal gland at the superior cortex of the right kidney. Left Kidney: Length: 11.1 cm. Echogenicity within normal limits. No mass or hydronephrosis visualized. Abdominal aorta: No aneurysm visualized. Other findings: None. IMPRESSION: Sonographic survey of the abdomen demonstrates no acute finding. No hydronephrosis. There is a 6 mm hyperechoic focus of the right liver which most likely represents a hemangioma in a patient of this age. Electronically Signed   By: Gilmer MorJaime  Wagner D.O.   On: 11/22/2018 13:20     Medications:   . sodium chloride    .  sodium bicarbonate  infusion 1000 mL 125 mL/hr at 11/22/18 1626   . Chlorhexidine Gluconate Cloth  6 each Topical Daily   acetaminophen **OR** acetaminophen, LORazepam, prochlorperazine  Assessment/ Plan:   Acute kidney injury secondary to rhabdomyolysis.  Classical findings of dipstick positive blood no RBCs per high-powered film.  Hemodynamically stable nothing to suggest Interstitial nephritis acute glomerulonephritis recheck of urinalysis was clear.  Creatinine appears to be improving  Metabolic acidosis resolved  Anemia not issue  Polysubstance abuse suggested counseling  Transaminitis still trending up would continue to follow.   LOS: 3 Garnetta BuddyMartin W Zayveon Raschke @TODAY @9 :34 AM

## 2018-11-23 NOTE — Progress Notes (Signed)
PROGRESS NOTE  Carla HeronJessica R Gross ZOX:[id number] DOB: [birth year] DOA: 11/20/2018 PCP: Patient, No Pcp Per  Brief History:  23 year old female with no documented chronic medical problems presented with altered mental status.  Apparently, the patient had been drinking "White Claw" seltzer on 11/19/2018.  On the following day, the patient was noted to have nausea and vomiting with associated urinary incontinence.  The patient herself has not had any recollection/memories for the past week.  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  The last thing she remembered was slightly over 1 week ago [facility] to come [facility].  She states that up until the morning of 11/22/2018 she was still "out of it".  She denied any fevers, chills, chest pain, shortness of breath, headache, neck pain, abdominal pain.  Lumbar puncture was performed on 11/20/2018 was unremarkable for infectious process.  Culture was negative and WBC was 2.  Initial CT of the brain was negative.  The patient herself states that she uses marijuana 3-4 times daily.  She denies any other illicit drug use.  Urine drug screen was positive for THC at the time of admission.  Assessment/Plan: Acute toxic encephalopathy -Secondary to postictal state andSubstance ingestion -Patient is back to baseline -Urinalysis negative for pyuria -Blood cultures and CSF cultures negative  Rhabdomyolysis -CPK still rising up to 26,928 -Continue IV fluids  Acute kidney injury -Secondary to rhabdomyolysis -Appreciate nephrology follow-up -continue bicarbonate drip -no hydronephrosis on abd US  Seizure disorder -11/21/2018 EEG--negative for epileptiform discharges -Suspect this is drug-induced seizure -I spoke with epileptology, Dr. Herbert DeanerAquino-->continue keppra and follow up in office -MR brain  Tongue laceration -Local care -try viscous lidocaine and magic mouthwash  Transaminasemia -elevation in part due to rhabdo as  muscle also produces AST and AST -abd ultrasound neg -HIV neg -AMA, ANA, ASMA -alpha 1 antitripysin     Disposition Plan:   Home in 1-2 days  Family Communication:   Family at bedside  Consultants:  renal  Code Status:  FULL   DVT Prophylaxis:  [place] Heparin / [place] Lovenox   Procedures: As Listed in Progress Note Above  Antibiotics: None    Patient denies fevers, chills, headache, chest pain, dyspnea, nausea, vomiting, diarrhea, abdominal pain, dysuria, hematuria, hematochezia, and melena.    Subjective: Patient denies fevers, chills, headache, chest pain, dyspnea, nausea, vomiting, diarrhea, abdominal pain, dysuria, hematuria, hematochezia, and melena.   Objective: Vitals:   11/22/18 1356 11/22/18 2110 11/23/18 0508 11/23/18 0906  BP: 134/87 (!) 149/99 (!) 143/91 128/89  Pulse: (!) 57 70 77 84  Resp: 18 14 16 16   Temp: 98.4 F (36.9 C) 98.2 F (36.8 C) 98.5 F (36.9 C) 99 F (37.2 C)  TempSrc: Oral Oral Oral Oral  SpO2: 100% 100% 100% 99%  Weight:   75.8 kg   Height:        Intake/Output Summary (Last 24 hours) at 11/23/2018 1208 Last data filed at 11/23/2018 0900 Gross per 24 hour  Intake 1720 ml  Output -  Net 1720 ml   Weight change:  Exam:   General:  Pt is alert, follows commands appropriately, not in acute distress  HEENT: No icterus, No thrush, No neck mass, [place]/AT  Cardiovascular: RRR, S1/S2, no rubs, no gallops  Respiratory: CTA bilaterally, no wheezing, no crackles, no rhonchi  Abdomen: Soft/+BS, non tender, non distended, no guarding  Extremities: No edema, No lymphangitis, No petechiae, No rashes,  no synovitis  Neuro:  CN II-XII intact, strength 4/5 in RUE, RLE, strength 4/5 LUE, LLE; sensation intact bilateral; no dysmetria; babinski equivocal     Data Reviewed: I have personally reviewed following labs and imaging studies Basic Metabolic Panel: Recent Labs  Lab 11/20/18 1658 11/20/18 1949 11/21/18 0413 11/22/18 0443  11/23/18 0514  NA 139  --  141 141 141  K 4.8  --  4.5 3.7 3.5  CL 110  --  116* 114* 109  CO2 16*  --  17* 21* 24  GLUCOSE 136*  --  127* 111* 114*  BUN 24*  --  31* 33* 24*  CREATININE 1.85*  --  3.30* 3.99* 2.88*  CALCIUM 9.6  --  8.3* 8.3* 8.4*  MG  --  2.8*  --   --   --    Liver Function Tests: Recent Labs  Lab 11/20/18 1658 11/21/18 0413 11/22/18 0443 11/23/18 0514  AST 63* 111* 162* 256*  ALT 29 34 57* 105*  ALKPHOS 55 40 38 41  BILITOT 1.1 1.3* 1.3* 1.2  PROT 7.8 6.0* 5.6* 6.0*  ALBUMIN 4.5 3.4* 3.1* 3.2*   No results for input(s): LIPASE, AMYLASE in the last 168 hours. No results for input(s): AMMONIA in the last 168 hours. Coagulation Profile: No results for input(s): INR, PROTIME in the last 168 hours. CBC: Recent Labs  Lab 11/20/18 1658 11/21/18 0413 11/22/18 0443 11/23/18 0514  WBC 24.5* 13.9* 10.4 7.1  NEUTROABS 22.5*  --   --   --   HGB 14.3 11.4* 10.7* 11.2*  HCT 42.0 34.3* 33.0* 33.5*  MCV 88.4 90.3 91.2 88.2  PLT 241 146* 128* 171   Cardiac Enzymes: Recent Labs  Lab 11/20/18 1949 11/21/18 0413 11/22/18 0443 11/23/18 0514  CKTOTAL 3,642* 8,074* 11,990* 26,928*   BNP: Invalid input(s): POCBNP CBG: Recent Labs  Lab 11/22/18 1145 11/22/18 1811 11/22/18 2348 11/23/18 0509 11/23/18 1108  GLUCAP 90 129* 117* 105* 106*   HbA1C: No results for input(s): HGBA1C in the last 72 hours. Urine analysis:    Component Value Date/Time   COLORURINE STRAW (A) 11/22/2018 1021   APPEARANCEUR CLEAR 11/22/2018 1021   LABSPEC 1.006 11/22/2018 1021   PHURINE 5.0 11/22/2018 1021   GLUCOSEU NEGATIVE 11/22/2018 1021   HGBUR LARGE (A) 11/22/2018 1021   BILIRUBINUR NEGATIVE 11/22/2018 1021   KETONESUR NEGATIVE 11/22/2018 1021   PROTEINUR NEGATIVE 11/22/2018 1021   NITRITE NEGATIVE 11/22/2018 1021   LEUKOCYTESUR NEGATIVE 11/22/2018 1021   Sepsis Labs: @LABRCNTIP (procalcitonin:4,lacticidven:4) ) Recent Results (from the past 240 hour(s))  Blood  culture (routine x 2)     Status: None (Preliminary result)   Collection Time: 11/20/18  6:00 PM   Specimen: BLOOD LEFT ARM  Result Value Ref Range Status   Specimen Description BLOOD LEFT ARM  Final   Special Requests   Final    BOTTLES DRAWN AEROBIC AND ANAEROBIC Blood Culture adequate volume   Culture   Final    NO GROWTH 3 DAYS Performed at [facility], [address] [place], [place] [address]    Report Status PENDING  Incomplete  Urine culture     Status: None   Collection Time: 11/20/18  6:10 PM   Specimen: Urine, Catheterized  Result Value Ref Range Status   Specimen Description   Final    URINE, CATHETERIZED Performed at [facility], [address] [place], [place] [address]    Special Requests   Final    NONE Performed at  [place] [place], [place] [address]    Culture   Final    NO GROWTH Performed at [facility], [place] [address], [place], [place] [address]    Report Status 11/22/2018 FINAL  Final  Blood culture (routine x 2)     Status: None (Preliminary result)   Collection Time: 11/20/18  6:17 PM   Specimen: BLOOD RIGHT HAND  Result Value Ref Range Status   Specimen Description BLOOD RIGHT HAND  Final   Special Requests   Final    BOTTLES DRAWN AEROBIC AND ANAEROBIC Blood Culture adequate volume   Culture   Final    NO GROWTH 3 DAYS Performed at [facility], [address], [place], [place] [address]    Report Status PENDING  Incomplete  Novel Coronavirus,NAA,(SEND-OUT TO REF LAB - Zaina Jenkin 24-48 hrs); Hosp Order     Status: None   Collection Time: 11/20/18  7:13 PM   Specimen: Oropharyngeal swab; Respiratory  Result Value Ref Range Status   SARS-CoV-2, NAA NOT DETECTED NOT DETECTED Final    Comment: (NOTE) This test was developed and its performance characteristics determined by [employer]. This test has not been FDA cleared or approved. This test has been authorized by FDA under an Emergency Use Authorization (EUA).  This test is only authorized for the duration of time the declaration that circumstances exist justifying the authorization of the emergency use of in vitro diagnostic tests for detection of SARS-CoV-2 virus and/or diagnosis of COVID-19 infection under section 564(b)(1) of the Act, 21 U.S.C. 916BWG-6(K)(5), unless the authorization is terminated or revoked sooner. When diagnostic testing is negative, the possibility of a false negative result should be considered in the context of a patient's recent exposures and the presence of clinical signs and symptoms consistent with COVID-19. An individual without symptoms of COVID-19 and who is not shedding SARS-CoV-2 virus would expect to have a negative (not detected) result in this assay. Performed  At: [facility] [address] [place], [place] [id number] Rush Farmer MD [id number]    [place]  Final    Comment: Performed at [facility], [address] [place], [place] [address]  CSF culture     Status: None (Preliminary result)   Collection Time: 11/20/18  7:13 PM   Specimen: CSF; Cerebrospinal Fluid  Result Value Ref Range Status   Specimen Description   Final    CSF Performed at [facility], [address] [place], [place] [address]    Special Requests   Final    NONE Performed at [facility], [address] [place], [place] [address]    Gram Stain   Final    NO CELLS OR ORGANISMS SEEN Gram Stain Report Called to,Read Back By and Verified With: NICHOLS,K. AT 2011 ON 11/20/2018 BY EVA Performed at [facility], [address], [place], [place] [address]    Culture   Final    NO GROWTH 2 DAYS Performed at [facility] [address] [place],  [address]    Report Status PENDING  Incomplete  MRSA PCR Screening     Status: None   Collection Time: 11/20/18 10:05 PM   Specimen: Nasal Mucosa; Nasopharyngeal  Result Value Ref Range Status   MRSA by PCR NEGATIVE NEGATIVE Final     Comment:        The GeneXpert MRSA Assay (FDA approved for NASAL specimens only), is one component of a comprehensive MRSA colonization surveillance program.  It is not intended to diagnose MRSA infection nor to guide or monitor treatment for MRSA infections. Performed at [facility], [address] [place], [place] [address]      Scheduled Meds: . Chlorhexidine Gluconate Cloth  6 each Topical Daily   Continuous Infusions: . sodium chloride    .  sodium bicarbonate  infusion 1000 mL 125 mL/hr at 11/22/18 1626    Procedures/Studies: Ct Head Wo Contrast  Result Date: 11/20/2018 CLINICAL DATA:  Per ED note. Pt brought in by ems after family called for possible seizure. Family reports she drank a few white claws yesterday by the pool and got sick. Pt had multiple episodes of urinating on self with vomiting. Today had a possible seizure per ems and family gave cbd oil under tongue. EXAM: CT HEAD WITHOUT CONTRAST TECHNIQUE: Contiguous axial images were obtained from the base of the skull through the vertex without intravenous contrast. COMPARISON:  None. FINDINGS: Brain: No evidence of acute infarction, hemorrhage, hydrocephalus, extra-axial collection or mass lesion/mass effect. Vascular: No hyperdense vessel or unexpected calcification. Skull: Normal. Negative for fracture or focal lesion. Sinuses/Orbits: Globes and orbits are unremarkable. Mucous retention cyst in the left sphenoid sinus. Mild, right greater than left, polypoid mucosal thickening in the inferior maxillary sinuses. Clear mastoid air cells. Other: None. IMPRESSION: 1. No intracranial abnormality. 2. Sinus mucosal thickening as described. Electronically Signed   By: Amie Portlandavid  Ormond M.D.   On: 11/20/2018 17:57   [place] Abdomen Complete  Result Date: 11/22/2018 CLINICAL DATA:  23 year old female with a history of increased creatinine and liver failure EXAM: ABDOMEN ULTRASOUND COMPLETE COMPARISON:  None. FINDINGS: Gallbladder: No  gallstones or wall thickening visualized. No sonographic Murphy sign noted by sonographer. Common bile duct: Diameter: 2 mm-3 mm Liver: There is a small hyperechoic rounded focus within the right liver measuring 6 mm. Unremarkable echotexture of the liver. IVC: No abnormality visualized. Pancreas: Visualized portion unremarkable. Spleen: Size and appearance within normal limits. Right Kidney: Length: 10.8 cm. Echogenicity within normal limits. No mass or hydronephrosis visualized. Fluid versus adrenal gland at the superior cortex of the right kidney. Left Kidney: Length: 11.1 cm. Echogenicity within normal limits. No mass or hydronephrosis visualized. Abdominal aorta: No aneurysm visualized. Other findings: None. IMPRESSION: Sonographic survey of the abdomen demonstrates no acute finding. No hydronephrosis. There is a 6 mm hyperechoic focus of the right liver which most likely represents a hemangioma in a patient of this age. Electronically Signed   By: Gilmer MorJaime  Wagner D.O.   On: 11/22/2018 13:20   Dg Chest Portable 1 View  Result Date: 11/20/2018 CLINICAL DATA:  Altered mental status. EXAM: PORTABLE CHEST 1 VIEW COMPARISON:  None. FINDINGS: Normal heart, mediastinum and hila. Clear lungs. No evidence of a pleural effusion. No gross pneumothorax on this supine exam. Skeletal structures are unremarkable. IMPRESSION: No active disease. Electronically Signed   By: Amie Portlandavid  Ormond M.D.   On: 11/20/2018 17:58    Catarina Hartshornavid Tanaiya Kolarik, DO  [facility] Pager [phone]  If 7PM-7AM, please contact night-coverage [email or web address] Password [email or web address] 11/23/2018, 12:08 PM   LOS: 3 days

## 2018-11-24 LAB — COMPREHENSIVE METABOLIC PANEL
ALT: 142 U/L — ABNORMAL HIGH (ref 0–44)
AST: 324 U/L — ABNORMAL HIGH (ref 15–41)
Albumin: 3.1 g/dL — ABNORMAL LOW (ref 3.5–5.0)
Alkaline Phosphatase: 42 U/L (ref 38–126)
Anion gap: 12 (ref 5–15)
BUN: 14 mg/dL (ref 6–20)
CO2: 22 mmol/L (ref 22–32)
Calcium: 8.6 mg/dL — ABNORMAL LOW (ref 8.9–10.3)
Chloride: 108 mmol/L (ref 98–111)
Creatinine, Ser: 2.14 mg/dL — ABNORMAL HIGH (ref 0.44–1.00)
GFR calc Af Amer: 37 mL/min — ABNORMAL LOW (ref >60)
GFR calc non Af Amer: 32 mL/min — ABNORMAL LOW (ref >60)
Glucose, Bld: 96 mg/dL (ref 70–99)
Potassium: 4 mmol/L (ref 3.5–5.1)
Sodium: 142 mmol/L (ref 135–145)
Total Bilirubin: 0.9 mg/dL (ref 0.3–1.2)
Total Protein: 5.9 g/dL — ABNORMAL LOW (ref 6.5–8.1)

## 2018-11-24 LAB — ANA W/REFLEX IF POSITIVE: Anti Nuclear Antibody (ANA): NEGATIVE

## 2018-11-24 LAB — GLUCOSE, CAPILLARY
Glucose-Capillary: 102 mg/dL — ABNORMAL HIGH (ref 70–99)
Glucose-Capillary: 104 mg/dL — ABNORMAL HIGH (ref 70–99)
Glucose-Capillary: 84 mg/dL (ref 70–99)

## 2018-11-24 LAB — CK: Total CK: 32362 U/L — ABNORMAL HIGH (ref 38–234)

## 2018-11-24 LAB — CSF CULTURE W GRAM STAIN: Culture: NO GROWTH

## 2018-11-24 LAB — MITOCHONDRIAL ANTIBODIES: Mitochondrial M2 Ab, IgG: <20 Units (ref 0.0–20.0)

## 2018-11-24 LAB — ANTI-SMOOTH MUSCLE ANTIBODY, IGG: F-Actin IgG: 6 Units (ref 0–19)

## 2018-11-24 LAB — ALPHA-1-ANTITRYPSIN: A-1 Antitrypsin, Ser: 166 mg/dL (ref 100–188)

## 2018-11-24 MED ORDER — SODIUM CHLORIDE 0.9 % IV SOLN
INTRAVENOUS | Status: DC
  Administered 2018-11-24 – 2018-11-25 (×2): via INTRAVENOUS

## 2018-11-24 NOTE — Progress Notes (Signed)
[facility] ROUNDING NOTE   Subjective:    HPI/subjective: Fluids were discontinued yesterday per checkout however bicarb gtt hanging and pt reports has been on IV fluids.  She asked if she can go home and I recommended no given that her CPK is still climbing thus the IV fluids.  ROS:  Denies shortness of breath or chest pain Denies nausea or vomiting   Background:  This is a pleasant 23 year old lady that was admitted 11/20/2018 following a seizure episode brought to the emergency room she been drinking binge drinking alcohol and using CBD oil.  She has developed acute kidney injury with an elevated CPK of nearly 12,000.  Ultrasound of liver showed a 6 mm hyperechoic focus most likely representing hemangioma.  Renal size was normal with no hydronephrosis visualized    Objective:  Vital signs in last 24 hours:  Temp:  [98.7 F (37.1 C)-99 F (37.2 C)] 99 F (37.2 C) (06/17 2109) Pulse Rate:  [54-90] 54 (06/18 0536) Resp:  [14-16] 16 (06/18 0536) BP: (128-157)/(89-98) 140/96 (06/18 0536) SpO2:  [99 %-100 %] 100 % (06/17 2109) Weight:  [74.5 kg] 74.5 kg (06/18 0500)  Weight change: -1.3 kg Filed Weights   11/21/18 0500 11/23/18 0508 11/24/18 0500  Weight: 72.2 kg 75.8 kg 74.5 kg    Intake/Output: I/O last 3 completed shifts: In: 1498.2 [P.O.:480; I.V.:1018.2] Out: -    Intake/Output this shift:  No intake/output data recorded.   Alert adult female NAD CVS- RRR JVP not elevated  RS- CTA no wheezes or rales ABD- BS present soft non-distended EXT- no edema Psych - normal mood and affect Neuro - conversant, alert and oriented and no gross focal motor deficits    Basic Metabolic Panel: Recent Labs  Lab 11/20/18 1658 11/20/18 1949 11/21/18 0413 11/22/18 0443 11/23/18 0514 11/24/18 0542  NA 139  --  141 141 141 142  K 4.8  --  4.5 3.7 3.5 4.0  CL 110  --  116* 114* 109 108  CO2 16*  --  17* 21* 24 22  GLUCOSE 136*  --  127* 111* 114* 96  BUN 24*   --  31* 33* 24* 14  CREATININE 1.85*  --  3.30* 3.99* 2.88* 2.14*  CALCIUM 9.6  --  8.3* 8.3* 8.4* 8.6*  MG  --  2.8*  --   --   --   --     Liver Function Tests: Recent Labs  Lab 11/20/18 1658 11/21/18 0413 11/22/18 0443 11/23/18 0514 11/24/18 0542  AST 63* 111* 162* 256* 324*  ALT 29 34 57* 105* 142*  ALKPHOS 55 40 38 41 42  BILITOT 1.1 1.3* 1.3* 1.2 0.9  PROT 7.8 6.0* 5.6* 6.0* 5.9*  ALBUMIN 4.5 3.4* 3.1* 3.2* 3.1*    CBC: Recent Labs  Lab 11/20/18 1658 11/21/18 0413 11/22/18 0443 11/23/18 0514  WBC 24.5* 13.9* 10.4 7.1  NEUTROABS 22.5*  --   --   --   HGB 14.3 11.4* 10.7* 11.2*  HCT 42.0 34.3* 33.0* 33.5*  MCV 88.4 90.3 91.2 88.2  PLT 241 146* 128* 171    Cardiac Enzymes: Recent Labs  Lab 11/20/18 1949 11/21/18 0413 11/22/18 0443 11/23/18 0514 11/24/18 0542  CKTOTAL 3,642* 8,074* 11,990* 26,928* 32,362*    CBG: Recent Labs  Lab 11/23/18 0509 11/23/18 1108 11/23/18 1606 11/24/18 0000 11/24/18 0537  GLUCAP 105* 106* 99 104* 102*    Microbiology: Results for orders placed or performed during the hospital encounter of 11/20/18  Blood culture (routine x 2)     Status: None (Preliminary result)   Collection Time: 11/20/18  6:00 PM   Specimen: BLOOD LEFT ARM  Result Value Ref Range Status   Specimen Description BLOOD LEFT ARM  Final   Special Requests   Final    BOTTLES DRAWN AEROBIC AND ANAEROBIC Blood Culture adequate volume   Culture   Final    NO GROWTH 3 DAYS Performed at [facility], [address], [place], [place] [address]    Report Status PENDING  Incomplete  Urine culture     Status: None   Collection Time: 11/20/18  6:10 PM   Specimen: Urine, Catheterized  Result Value Ref Range Status   Specimen Description   Final    URINE, CATHETERIZED Performed at [facility], [address], [place], [place] [address]    Special Requests   Final    NONE Performed at [facility], [address], [place], [place] [address]    Culture    Final    NO GROWTH Performed at [facility], [place] [address] [place], [place] [address]    Report Status 11/22/2018 FINAL  Final  Blood culture (routine x 2)     Status: None (Preliminary result)   Collection Time: 11/20/18  6:17 PM   Specimen: BLOOD RIGHT HAND  Result Value Ref Range Status   Specimen Description BLOOD RIGHT HAND  Final   Special Requests   Final    BOTTLES DRAWN AEROBIC AND ANAEROBIC Blood Culture adequate volume   Culture   Final    NO GROWTH 3 DAYS Performed at [facility], [address], [place], [place] [address]    Report Status PENDING  Incomplete  Novel Coronavirus,NAA,(SEND-OUT TO REF LAB - TAT 24-48 hrs); Hosp Order     Status: None   Collection Time: 11/20/18  7:13 PM   Specimen: Oropharyngeal swab; Respiratory  Result Value Ref Range Status   SARS-CoV-2, NAA NOT DETECTED NOT DETECTED Final    Comment: (NOTE) This test was developed and its performance characteristics determined by [employer]. This test has not been FDA cleared or approved. This test has been authorized by FDA under an Emergency Use Authorization (EUA). This test is only authorized for the duration of time the declaration that circumstances exist justifying the authorization of the emergency use of in vitro diagnostic tests for detection of SARS-CoV-2 virus and/or diagnosis of COVID-19 infection under section 564(b)(1) of the Act, 21 U.S.C. 696EXB-2(W)(4), unless the authorization is terminated or revoked sooner. When diagnostic testing is negative, the possibility of a false negative result should be considered in the context of a patient's recent exposures and the presence of clinical signs and symptoms consistent with COVID-19. An individual without symptoms of COVID-19 and who is not shedding SARS-CoV-2 virus would expect to have a negative (not detected) result in this assay. Performed  At: [facility] [address] [place], [place]  [id number] Rush Farmer MD VO:[id number]    [place]  Final    Comment: Performed at [facility], [address] [place], [place] [address]  CSF culture     Status: None (Preliminary result)   Collection Time: 11/20/18  7:13 PM   Specimen: CSF; Cerebrospinal Fluid  Result Value Ref Range Status   Specimen Description   Final    CSF Performed at [facility], [address], [place], [place] [address]    Special Requests   Final    NONE  Performed at [facility], [address] [place], [place] [address]    Gram Stain   Final    NO CELLS OR ORGANISMS SEEN Gram Stain Report Called to,Read Back By and Verified With: NICHOLS,K. AT 2011 ON 11/20/2018 BY EVA Performed at [facility], [address] [place], [place] [address]    Culture   Final    NO GROWTH 2 DAYS Performed at [facility], [address] [place], [place] [address]    Report Status PENDING  Incomplete  MRSA PCR Screening     Status: None   Collection Time: 11/20/18 10:05 PM   Specimen: Nasal Mucosa; Nasopharyngeal  Result Value Ref Range Status   MRSA by PCR NEGATIVE NEGATIVE Final    Comment:        The GeneXpert MRSA Assay (FDA approved for NASAL specimens only), is one component of a comprehensive MRSA colonization surveillance program. It is not intended to diagnose MRSA infection nor to guide or monitor treatment for MRSA infections. Performed at [facility], [address] [place], [place] [address]   Urinalysis: Recent Labs    11/22/18 1021  COLORURINE STRAW*  LABSPEC 1.006  PHURINE 5.0  GLUCOSEU NEGATIVE  HGBUR LARGE*  BILIRUBINUR NEGATIVE  KETONESUR NEGATIVE  PROTEINUR NEGATIVE  NITRITE NEGATIVE  LEUKOCYTESUR NEGATIVE      Imaging: [place] Abdomen Complete  Result Date: 11/22/2018 CLINICAL DATA:  23 year old female with a history of increased creatinine and liver failure EXAM: ABDOMEN ULTRASOUND COMPLETE COMPARISON:  None. FINDINGS: Gallbladder: No  gallstones or wall thickening visualized. No sonographic Murphy sign noted by sonographer. Common bile duct: Diameter: 2 mm-3 mm Liver: There is a small hyperechoic rounded focus within the right liver measuring 6 mm. Unremarkable echotexture of the liver. IVC: No abnormality visualized. Pancreas: Visualized portion unremarkable. Spleen: Size and appearance within normal limits. Right Kidney: Length: 10.8 cm. Echogenicity within normal limits. No mass or hydronephrosis visualized. Fluid versus adrenal gland at the superior cortex of the right kidney. Left Kidney: Length: 11.1 cm. Echogenicity within normal limits. No mass or hydronephrosis visualized. Abdominal aorta: No aneurysm visualized. Other findings: None. IMPRESSION: Sonographic survey of the abdomen demonstrates no acute finding. No hydronephrosis. There is a 6 mm hyperechoic focus of the right liver which most likely represents a hemangioma in a patient of this age. Electronically Signed   By: Gilmer MorJaime  Wagner D.O.   On: 11/22/2018 13:20     Medications:   . sodium chloride 10 mL/hr at 11/23/18 1227  .  sodium bicarbonate  infusion 1000 mL 125 mL/hr at 11/24/18 0641   . Chlorhexidine Gluconate Cloth  6 each Topical Daily  . levETIRAcetam  500 mg Oral BID   acetaminophen **OR** acetaminophen, lidocaine, LORazepam, magic mouthwash, prochlorperazine  Assessment/ Plan:   Acute kidney injury secondary to rhabdomyolysis.  Classical findings of dipstick positive blood no RBCs per high-powered film.  Resolving with supportive care.  CPK has not yet peaked and over 30,000.  Discontinue bicarb gtt and transition to normal saline  Metabolic acidosis resolved.  D/c bicarb gtt   Anemia mild  Polysubstance abuse encouraged cessation and counseling  Transaminitis still trending up would continue to follow.  HTN - a little better today  dispo - continue inpatient with need for IV fluids.   LOS: 4 Estanislado EmmsLori C Brittnye Josephs 11/24/2018  7:55 AM

## 2018-11-24 NOTE — Progress Notes (Signed)
PROGRESS NOTE  AMRIT ERCK IYM:[id number] DOB: 07-Apr-1996 DOA: 11/20/2018 PCP: Patient, No Pcp Per  Brief History:  23 year old female with no documented chronic medical problems presented with altered mental status.  Apparently, the patient had been drinking "White Claw" seltzer on 11/19/2018.  On the following day, the patient was noted to have nausea and vomiting with associated urinary incontinence.  The patient herself has not had any recollection/memories for the past week.  The patient is a Electronics engineer at [employer].  The last thing she remembered was slightly over 1 week ago still being at [place] to come home.  She states that up until the morning of 11/22/2018 she was still "out of it".  She denied any fevers, chills, chest pain, shortness of breath, headache, neck pain, abdominal pain.  Lumbar puncture was performed on 11/20/2018 was unremarkable for infectious process.  Culture was negative and WBC was 2.  Initial CT of the brain was negative.  The patient herself states that she uses marijuana 3-4 times daily.  She denies any other illicit drug use.  Urine drug screen was positive for THC at the time of admission.  Assessment/Plan: Acute toxic encephalopathy -Secondary to postictal state andSubstance ingestion -Patient is back to baseline -Urinalysis negative for pyuria -Blood cultures and CSF cultures negative  Rhabdomyolysis/Myopathy -CPK still rising up to 32K -Continue IV fluids -6/18--spoke with neuromuscular specialist-->check myositis panel, ESR, CRP, coxsackie antibodies -may need muscle biopsy if does not improve  Acute kidney injury -Secondary to rhabdomyolysis -Appreciate nephrology follow-up -continue bicarbonate drip -no hydronephrosis on abd [place]  Seizure disorder -11/21/2018 EEG--negative for epileptiform discharges -Suspect this is drug-induced seizure -I spoke with epileptology, Dr. 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  Tongue laceration -Local care -try viscous lidocaine and magic mouthwash  Transaminasemia -elevation in part due to rhabdo as muscle also produces AST and ALT -abd ultrasound neg -HIV neg -AMA, ANA, ASMA--neg -alpha 1 antitripysin--neg     Disposition Plan:   Home when CKs start improving  Family Communication:   Family at bedside  Consultants:  renal  Code Status:  FULL   DVT Prophylaxis:  [place] Heparin / [place] Lovenox   Procedures: As Listed in Progress Note Above  Antibiotics: None     Subjective: Patient denies fevers, chills, headache, chest pain, dyspnea, nausea, vomiting, diarrhea, abdominal pain, dysuria, hematuria, hematochezia, and melena. Pt denies any muscle weakness.  Minimal pain in arms.  Objective: Vitals:   11/23/18 2109 11/24/18 0500 11/24/18 0536 11/24/18 1355  BP: (!) 157/98  (!) 140/96 (!) 156/103  Pulse: 81  (!) 54 (!) 59  Resp: '14  16 16  ' Temp: 99 F (37.2 C)   98.8 F (37.1 C)  TempSrc: Oral   Oral  SpO2: 100%   100%  Weight:  74.5 kg    Height:        Intake/Output Summary (Last 24 hours) at 11/24/2018 1758 Last data filed at 11/24/2018 1500 Gross per 24 hour  Intake 1162.99 ml  Output -  Net 1162.99 ml   Weight change: -1.3 kg Exam:   General:  Pt is alert, follows commands appropriately, not in acute distress  HEENT: No icterus, No thrush, No neck mass, [place]/AT  Cardiovascular: RRR, S1/S2, no rubs, no gallops  Respiratory: CTA bilaterally, no wheezing, no crackles, no rhonchi  Abdomen: Soft/+BS, non tender, non distended, no guarding  Extremities: No edema, No lymphangitis, No petechiae, No rashes, no synovitis   Data Reviewed: I have personally reviewed following labs and imaging studies Basic Metabolic Panel: Recent Labs  Lab 11/20/18 1658 11/20/18 1949 11/21/18 0413 11/22/18 0443 11/23/18 0514 11/24/18 0542  NA 139  --  141 141 141 142  K 4.8   --  4.5 3.7 3.5 4.0  CL 110  --  116* 114* 109 108  CO2 16*  --  17* 21* 24 22  GLUCOSE 136*  --  127* 111* 114* 96  BUN 24*  --  31* 33* 24* 14  CREATININE 1.85*  --  3.30* 3.99* 2.88* 2.14*  CALCIUM 9.6  --  8.3* 8.3* 8.4* 8.6*  MG  --  2.8*  --   --   --   --    Liver Function Tests: Recent Labs  Lab 11/20/18 1658 11/21/18 0413 11/22/18 0443 11/23/18 0514 11/24/18 0542  AST 63* 111* 162* 256* 324*  ALT 29 34 57* 105* 142*  ALKPHOS 55 40 38 41 42  BILITOT 1.1 1.3* 1.3* 1.2 0.9  PROT 7.8 6.0* 5.6* 6.0* 5.9*  ALBUMIN 4.5 3.4* 3.1* 3.2* 3.1*   No results for input(s): LIPASE, AMYLASE in the last 168 hours. No results for input(s): AMMONIA in the last 168 hours. Coagulation Profile: No results for input(s): INR, PROTIME in the last 168 hours. CBC: Recent Labs  Lab 11/20/18 1658 11/21/18 0413 11/22/18 0443 11/23/18 0514  WBC 24.5* 13.9* 10.4 7.1  NEUTROABS 22.5*  --   --   --   HGB 14.3 11.4* 10.7* 11.2*  HCT 42.0 34.3* 33.0* 33.5*  MCV 88.4 90.3 91.2 88.2  PLT 241 146* 128* 171   Cardiac Enzymes: Recent Labs  Lab 11/20/18 1949 11/21/18 0413 11/22/18 0443 11/23/18 0514 11/24/18 0542  CKTOTAL 3,642* 8,074* 11,990* 26,928* 32,362*   BNP: Invalid input(s): POCBNP CBG: Recent Labs  Lab 11/23/18 1108 11/23/18 1606 11/24/18 0000 11/24/18 0537 11/24/18 1146  GLUCAP 106* 99 104* 102* 84   HbA1C: No results for input(s): HGBA1C in the last 72 hours. Urine analysis:    Component Value Date/Time   COLORURINE STRAW (A) 11/22/2018 1021   APPEARANCEUR CLEAR 11/22/2018 1021   LABSPEC 1.006 11/22/2018 1021   PHURINE 5.0 11/22/2018 1021   GLUCOSEU NEGATIVE 11/22/2018 1021   HGBUR LARGE (A) 11/22/2018 1021   BILIRUBINUR NEGATIVE 11/22/2018 1021   KETONESUR NEGATIVE 11/22/2018 1021   PROTEINUR NEGATIVE 11/22/2018 1021   NITRITE NEGATIVE 11/22/2018 1021   LEUKOCYTESUR NEGATIVE 11/22/2018 1021   Sepsis Labs: '@LABRCNTIP' (procalcitonin:4,lacticidven:4) )  Recent Results (from the past 240 hour(s))  Blood culture (routine x 2)     Status: None (Preliminary result)   Collection Time: 11/20/18  6:00 PM   Specimen: BLOOD LEFT ARM  Result Value Ref Range Status   Specimen Description BLOOD LEFT ARM  Final   Special Requests   Final    BOTTLES DRAWN AEROBIC AND ANAEROBIC Blood Culture adequate volume   Culture   Final    NO GROWTH 4 DAYS Performed at [facility], [address], [place], [place] [address]    Report Status PENDING  Incomplete  Urine culture     Status: None   Collection Time: 11/20/18  6:10 PM   Specimen: Urine, Catheterized  Result Value Ref Range Status   Specimen Description   Final    URINE, CATHETERIZED Performed at [facility], [address], [place],  [address]    Special Requests  Final    NONE Performed at [facility], [address] [place], [place] [address]    Culture   Final    NO GROWTH Performed at [facility], [place] [address] [place], [place] [address]    Report Status 11/22/2018 FINAL  Final  Blood culture (routine x 2)     Status: None (Preliminary result)   Collection Time: 11/20/18  6:17 PM   Specimen: BLOOD RIGHT HAND  Result Value Ref Range Status   Specimen Description BLOOD RIGHT HAND  Final   Special Requests   Final    BOTTLES DRAWN AEROBIC AND ANAEROBIC Blood Culture adequate volume   Culture   Final    NO GROWTH 4 DAYS Performed at [facility], [address], [place], [place] [address]    Report Status PENDING  Incomplete  Novel Coronavirus,NAA,(SEND-OUT TO REF LAB - [facility] 24-48 hrs); Hosp Order     Status: None   Collection Time: 11/20/18  7:13 PM   Specimen: Oropharyngeal swab; Respiratory  Result Value Ref Range Status   SARS-CoV-2, NAA NOT DETECTED NOT DETECTED Final    Comment: (NOTE) This test was developed and its performance characteristics determined by [employer]. This test has not been FDA cleared or approved. This test has been authorized by  FDA under an Emergency Use Authorization (EUA). This test is only authorized for the duration of time the declaration that circumstances exist justifying the authorization of the emergency use of in vitro diagnostic tests for detection of SARS-CoV-2 virus and/or diagnosis of COVID-19 infection under section 564(b)(1) of the Act, 21 U.S.C. 993ZJI-9(C)(7), unless the authorization is terminated or revoked sooner. When diagnostic testing is negative, the possibility of a false negative result should be considered in the context of a patient's recent exposures and the presence of clinical signs and symptoms consistent with COVID-19. An individual without symptoms of COVID-19 and who is not shedding SARS-CoV-2 virus would expect to have a negative (not detected) result in this assay. Performed  At: [facility] [address] [place], [place] [id number] Rush Farmer MD ZW:[id number]    [place]  Final    Comment: Performed at [facility], [address] [place], [place] [address]  CSF culture     Status: None   Collection Time: 11/20/18  7:13 PM   Specimen: CSF; Cerebrospinal Fluid  Result Value Ref Range Status   Specimen Description   Final    CSF Performed at [facility], [address] [place], [place] [address]    Special Requests   Final    NONE Performed at [facility], [address] [place], [place] [address]    Gram Stain   Final    NO CELLS OR ORGANISMS SEEN Gram Stain Report Called to,Read Back By and Verified With: NICHOLS,K. AT 2011 ON 11/20/2018 BY EVA Performed at [facility], [address], [place], [place] [address]    Culture   Final    NO GROWTH 3 DAYS Performed at [facility], [place] [address], [place],  [address]    Report Status 11/24/2018 FINAL  Final  MRSA PCR Screening     Status: None   Collection Time: 11/20/18 10:05 PM   Specimen: Nasal Mucosa; Nasopharyngeal  Result Value Ref Range Status   MRSA  by PCR NEGATIVE NEGATIVE Final    Comment:        The GeneXpert MRSA Assay (FDA approved for NASAL specimens only), is one component of  a comprehensive MRSA colonization surveillance program. It is not intended to diagnose MRSA infection nor to guide or monitor treatment for MRSA infections. Performed at [facility], [address], [place], [place] [address]      Scheduled Meds: . Chlorhexidine Gluconate Cloth  6 each Topical Daily  . levETIRAcetam  500 mg Oral BID   Continuous Infusions: . sodium chloride 125 mL/hr at 11/24/18 1500    Procedures/Studies: Ct Head Wo Contrast  Result Date: 11/20/2018 CLINICAL DATA:  Per ED note. Pt brought in by ems after family called for possible seizure. Family reports she drank a few white claws yesterday by the pool and got sick. Pt had multiple episodes of urinating on self with vomiting. Today had a possible seizure per ems and family gave cbd oil under tongue. EXAM: CT HEAD WITHOUT CONTRAST TECHNIQUE: Contiguous axial images were obtained from the base of the skull through the vertex without intravenous contrast. COMPARISON:  None. FINDINGS: Brain: No evidence of acute infarction, hemorrhage, hydrocephalus, extra-axial collection or mass lesion/mass effect. Vascular: No hyperdense vessel or unexpected calcification. Skull: Normal. Negative for fracture or focal lesion. Sinuses/Orbits: Globes and orbits are unremarkable. Mucous retention cyst in the left sphenoid sinus. Mild, right greater than left, polypoid mucosal thickening in the inferior maxillary sinuses. Clear mastoid air cells. Other: None. IMPRESSION: 1. No intracranial abnormality. 2. Sinus mucosal thickening as described. Electronically Signed   By: Lajean Manes M.D.   On: 11/20/2018 17:57   US Abdomen Complete  Result Date: 11/22/2018 CLINICAL DATA:  23 year old female with a history of increased creatinine and liver failure EXAM: ABDOMEN ULTRASOUND COMPLETE COMPARISON:  None.  FINDINGS: Gallbladder: No gallstones or wall thickening visualized. No sonographic Murphy sign noted by sonographer. Common bile duct: Diameter: 2 mm-3 mm Liver: There is a small hyperechoic rounded focus within the right liver measuring 6 mm. Unremarkable echotexture of the liver. IVC: No abnormality visualized. Pancreas: Visualized portion unremarkable. Spleen: Size and appearance within normal limits. Right Kidney: Length: 10.8 cm. Echogenicity within normal limits. No mass or hydronephrosis visualized. Fluid versus adrenal gland at the superior cortex of the right kidney. Left Kidney: Length: 11.1 cm. Echogenicity within normal limits. No mass or hydronephrosis visualized. Abdominal aorta: No aneurysm visualized. Other findings: None. IMPRESSION: Sonographic survey of the abdomen demonstrates no acute finding. No hydronephrosis. There is a 6 mm hyperechoic focus of the right liver which most likely represents a hemangioma in a patient of this age. Electronically Signed   By: Corrie Mckusick D.O.   On: 11/22/2018 13:20   Dg Chest Portable 1 View  Result Date: 11/20/2018 CLINICAL DATA:  Altered mental status. EXAM: PORTABLE CHEST 1 VIEW COMPARISON:  None. FINDINGS: Normal heart, mediastinum and hila. Clear lungs. No evidence of a pleural effusion. No gross pneumothorax on this supine exam. Skeletal structures are unremarkable. IMPRESSION: No active disease. Electronically Signed   By: Lajean Manes M.D.   On: 11/20/2018 17:58    Orson Eva, DO  [facility] Pager [phone]  If 7PM-7AM, please contact night-coverage [email or web address] Password [email or web address] 11/24/2018, 5:58 PM   LOS: 4 days

## 2018-11-25 LAB — COMPREHENSIVE METABOLIC PANEL
ALT: 155 U/L — ABNORMAL HIGH (ref 0–44)
AST: 282 U/L — ABNORMAL HIGH (ref 15–41)
Albumin: 3.2 g/dL — ABNORMAL LOW (ref 3.5–5.0)
Alkaline Phosphatase: 43 U/L (ref 38–126)
Anion gap: 10 (ref 5–15)
BUN: 11 mg/dL (ref 6–20)
CO2: 24 mmol/L (ref 22–32)
Calcium: 8.6 mg/dL — ABNORMAL LOW (ref 8.9–10.3)
Chloride: 108 mmol/L (ref 98–111)
Creatinine, Ser: 1.61 mg/dL — ABNORMAL HIGH (ref 0.44–1.00)
GFR calc Af Amer: 52 mL/min — ABNORMAL LOW (ref >60)
GFR calc non Af Amer: 45 mL/min — ABNORMAL LOW (ref >60)
Glucose, Bld: 96 mg/dL (ref 70–99)
Potassium: 3.7 mmol/L (ref 3.5–5.1)
Sodium: 142 mmol/L (ref 135–145)
Total Bilirubin: 1.1 mg/dL (ref 0.3–1.2)
Total Protein: 6.3 g/dL — ABNORMAL LOW (ref 6.5–8.1)

## 2018-11-25 LAB — CULTURE, BLOOD (ROUTINE X 2)
Culture: NO GROWTH
Culture: NO GROWTH
Special Requests: ADEQUATE
Special Requests: ADEQUATE

## 2018-11-25 LAB — CBC WITH DIFFERENTIAL/PLATELET
Abs Immature Granulocytes: 0.02 10*3/uL (ref 0.00–0.07)
Basophils Absolute: 0 10*3/uL (ref 0.0–0.1)
Basophils Relative: 1 %
Eosinophils Absolute: 0.1 10*3/uL (ref 0.0–0.5)
Eosinophils Relative: 2 %
HCT: 33.6 % — ABNORMAL LOW (ref 36.0–46.0)
Hemoglobin: 11.3 g/dL — ABNORMAL LOW (ref 12.0–15.0)
Immature Granulocytes: 0 %
Lymphocytes Relative: 30 %
Lymphs Abs: 1.8 10*3/uL (ref 0.7–4.0)
MCH: 29.9 pg (ref 26.0–34.0)
MCHC: 33.6 g/dL (ref 30.0–36.0)
MCV: 88.9 fL (ref 80.0–100.0)
Monocytes Absolute: 0.5 10*3/uL (ref 0.1–1.0)
Monocytes Relative: 8 %
Neutro Abs: 3.6 10*3/uL (ref 1.7–7.7)
Neutrophils Relative %: 59 %
Platelets: 193 10*3/uL (ref 150–400)
RBC: 3.78 MIL/uL — ABNORMAL LOW (ref 3.87–5.11)
RDW: 12.5 % (ref 11.5–15.5)
WBC: 6.1 10*3/uL (ref 4.0–10.5)
nRBC: 0 % (ref 0.0–0.2)

## 2018-11-25 LAB — SEDIMENTATION RATE: Sed Rate: 45 mm/hr — ABNORMAL HIGH (ref 0–22)

## 2018-11-25 LAB — CK: Total CK: 23067 U/L — ABNORMAL HIGH (ref 38–234)

## 2018-11-25 LAB — C-REACTIVE PROTEIN: CRP: 2.1 mg/dL — ABNORMAL HIGH (ref <1.0)

## 2018-11-25 MED ORDER — NICOTINE 21 MG/24HR TD PT24
21.0000 mg | MEDICATED_PATCH | Freq: Every day | TRANSDERMAL | Status: DC
  Administered 2018-11-25 – 2018-11-26 (×2): 21 mg via TRANSDERMAL
  Filled 2018-11-25 (×2): qty 1

## 2018-11-25 NOTE — Progress Notes (Signed)
[facility] ROUNDING NOTE   Subjective:    HPI/subjective:  Patient's normal saline was restarted yesterday and her bicarb gtt was discontinued.  She feels well today.  She has stopped using her Juul and has stopped smoking as of this hospitalization and is hoping to remain off of both.  At baseline she states BP slightly elevated which she had attributed to nicotine use; she does not recall specific numbers but not usually this high.  She's never been on meds for it before and states she doesn't have a PCP.   ROS:  Denies shortness of breath or chest pain Denies nausea or vomiting   Background:  This is a pleasant 23 year old lady that was admitted 11/20/2018 following a seizure episode brought to the emergency room she been drinking binge drinking alcohol and using CBD oil.  She has developed acute kidney injury with an elevated CPK of nearly 12,000.  Ultrasound of liver showed a 6 mm hyperechoic focus most likely representing hemangioma.  Renal size was normal with no hydronephrosis visualized    Objective:  Vital signs in last 24 hours:  Temp:  [98.3 F (36.8 C)-98.8 F (37.1 C)] 98.3 F (36.8 C) (06/19 0537) Pulse Rate:  [46-81] 46 (06/19 0537) Resp:  [16-18] 16 (06/19 0537) BP: (136-158)/(96-103) 156/96 (06/19 0537) SpO2:  [96 %-100 %] 100 % (06/19 0537) Weight:  [75.5 kg] 75.5 kg (06/19 0537)  Weight change: 1 kg Filed Weights   11/23/18 0508 11/24/18 0500 11/25/18 0537  Weight: 75.8 kg 74.5 kg 75.5 kg    Intake/Output: I/O last 3 completed shifts: In: 3177.8 [P.O.:640; I.V.:2537.8] Out: -    Intake/Output this shift:  No intake/output data recorded.   Alert adult female NAD CVS- RRR JVP not elevated  RS- clear to auscultation; unlabored  ABD- BS present soft non-distended EXT- she has no edema Psych - normal mood and affect Neuro - conversant, alert and oriented and no gross focal motor deficits  GU - no foley  Basic Metabolic Panel: Recent  Labs  Lab 11/20/18 1949 11/21/18 0413 11/22/18 0443 11/23/18 0514 11/24/18 0542 11/25/18 0557  NA  --  141 141 141 142 142  K  --  4.5 3.7 3.5 4.0 3.7  CL  --  116* 114* 109 108 108  CO2  --  17* 21* 24 22 24   GLUCOSE  --  127* 111* 114* 96 96  BUN  --  31* 33* 24* 14 11  CREATININE  --  3.30* 3.99* 2.88* 2.14* 1.61*  CALCIUM  --  8.3* 8.3* 8.4* 8.6* 8.6*  MG 2.8*  --   --   --   --   --     Liver Function Tests: Recent Labs  Lab 11/21/18 0413 11/22/18 0443 11/23/18 0514 11/24/18 0542 11/25/18 0557  AST 111* 162* 256* 324* 282*  ALT 34 57* 105* 142* 155*  ALKPHOS 40 38 41 42 43  BILITOT 1.3* 1.3* 1.2 0.9 1.1  PROT 6.0* 5.6* 6.0* 5.9* 6.3*  ALBUMIN 3.4* 3.1* 3.2* 3.1* 3.2*    CBC: Recent Labs  Lab 11/20/18 1658 11/21/18 0413 11/22/18 0443 11/23/18 0514 11/25/18 0557  WBC 24.5* 13.9* 10.4 7.1 6.1  NEUTROABS 22.5*  --   --   --  3.6  HGB 14.3 11.4* 10.7* 11.2* 11.3*  HCT 42.0 34.3* 33.0* 33.5* 33.6*  MCV 88.4 90.3 91.2 88.2 88.9  PLT 241 146* 128* 171 193    Cardiac Enzymes: Recent Labs  Lab 11/21/18 0413  11/22/18 0443 11/23/18 0514 11/24/18 0542 11/25/18 0557  CKTOTAL 8,074* 11,990* 26,928* 32,362* 23,067*    CBG: Recent Labs  Lab 11/23/18 1108 11/23/18 1606 11/24/18 0000 11/24/18 0537 11/24/18 1146  GLUCAP 106* 99 104* 102* 84    Microbiology: Results for orders placed or performed during the hospital encounter of 11/20/18  Blood culture (routine x 2)     Status: None   Collection Time: 11/20/18  6:00 PM   Specimen: BLOOD LEFT ARM  Result Value Ref Range Status   Specimen Description BLOOD LEFT ARM  Final   Special Requests   Final    BOTTLES DRAWN AEROBIC AND ANAEROBIC Blood Culture adequate volume   Culture   Final    NO GROWTH 5 DAYS Performed at [facility], [address] [place], [place] [address]    Report Status 11/25/2018 FINAL  Final  Urine culture     Status: None   Collection Time: 11/20/18  6:10 PM   Specimen:  Urine, Catheterized  Result Value Ref Range Status   Specimen Description   Final    URINE, CATHETERIZED Performed at [facility], [address] [place], [place] [address]    Special Requests   Final    NONE Performed at [facility], [address] [place], [place] [address]    Culture   Final    NO GROWTH Performed at [facility], [address] [place], [place] [address]    Report Status 11/22/2018 FINAL  Final  Blood culture (routine x 2)     Status: None   Collection Time: 11/20/18  6:17 PM   Specimen: BLOOD RIGHT HAND  Result Value Ref Range Status   Specimen Description BLOOD RIGHT HAND  Final   Special Requests   Final    BOTTLES DRAWN AEROBIC AND ANAEROBIC Blood Culture adequate volume   Culture   Final    NO GROWTH 5 DAYS Performed at [facility], [address] [place], [place] [address]    Report Status 11/25/2018 FINAL  Final  Novel Coronavirus,NAA,(SEND-OUT TO REF LAB - TAT 24-48 hrs); Hosp Order     Status: None   Collection Time: 11/20/18  7:13 PM   Specimen: Oropharyngeal swab; Respiratory  Result Value Ref Range Status   SARS-CoV-2, NAA NOT DETECTED NOT DETECTED Final    Comment: (NOTE) This test was developed and its performance characteristics determined by [facility]. This test has not been FDA cleared or approved. This test has been authorized by FDA under an Emergency Use Authorization (EUA). This test is only authorized for the duration of time the declaration that circumstances exist justifying the authorization of the emergency use of in vitro diagnostic tests for detection of SARS-CoV-2 virus and/or diagnosis of COVID-19 infection under section 564(b)(1) of the Act, 21 U.S.C. 657QIO-9(G)(2360bbb-3(b)(1), unless the authorization is terminated or revoked sooner. When diagnostic testing is negative, the possibility of a false negative result should be considered in the context of a patient's recent exposures and the presence of clinical  signs and symptoms consistent with COVID-19. An individual without symptoms of COVID-19 and who is not shedding SARS-CoV-2 virus would expect to have a negative (not detected) result in this assay. Performed  At: [facility] [address] [place], [place] [id number] Jolene SchimkeNagendra Sanjai MD MW:[phone]    Coronavirus Source NASOPHARYNGEAL  Final    Comment: Performed at [facility], [address] [place], [place] [address]  CSF culture     Status: None  Collection Time: 11/20/18  7:13 PM   Specimen: CSF; Cerebrospinal Fluid  Result Value Ref Range Status   Specimen Description   Final    CSF Performed at [facility], [address] [place], [place] [address]    Special Requests   Final    NONE Performed at [facility], [address] [place], [place] [address]    Gram Stain   Final    NO CELLS OR ORGANISMS SEEN Gram Stain Report Called to,Read Back By and Verified With: NICHOLS,K. AT 2011 ON 11/20/2018 BY EVA Performed at [facility], [address] [place], [place] [address]    Culture   Final    NO GROWTH 3 DAYS Performed at [facility], [address] [place], [place] [address]    Report Status 11/24/2018 FINAL  Final  MRSA PCR Screening     Status: None   Collection Time: 11/20/18 10:05 PM   Specimen: Nasal Mucosa; Nasopharyngeal  Result Value Ref Range Status   MRSA by PCR NEGATIVE NEGATIVE Final    Comment:        The GeneXpert MRSA Assay (FDA approved for NASAL specimens only), is one component of a comprehensive MRSA colonization surveillance program. It is not intended to diagnose MRSA infection nor to guide or monitor treatment for MRSA infections. Performed at [facility], [address] [place], [place] [address]   Urinalysis: Recent Labs    11/22/18 1021  COLORURINE STRAW*  LABSPEC 1.006  PHURINE 5.0  GLUCOSEU NEGATIVE  HGBUR LARGE*  BILIRUBINUR NEGATIVE  KETONESUR NEGATIVE  PROTEINUR NEGATIVE  NITRITE NEGATIVE   LEUKOCYTESUR NEGATIVE      Imaging: No results found.   Medications:   . sodium chloride 125 mL/hr at 11/25/18 0102   . Chlorhexidine Gluconate Cloth  6 each Topical Daily  . levETIRAcetam  500 mg Oral BID   acetaminophen **OR** acetaminophen, lidocaine, LORazepam, magic mouthwash, prochlorperazine  Assessment/ Plan:   Acute kidney injury secondary to rhabdomyolysis secondary to seizure.  Classical findings of dipstick positive blood no RBCs per high-powered film.  Cr peaked at 3.99. Renal ultrasound no acute findings or hydro. Resolving with supportive care.  CPK peaked at 32,000 and now downtrending.  Can discontinue normal saline infusion   Metabolic acidosis resolved.   Anemia mild and stable  Polysubstance abuse encouraged cessation and counseling  Transaminitis - AST appears to have peaked.  Per primary team.  Work-up thus far includes ANA (neg), anti-mitochondrial ab neg, anti-smooth muscle neg, and alpha1 anti-trypsin neg  Seizure disorder - per primary team   HTN - she is s/p large volume fluid resuscitation for rhabdo and salt load may be contributing.  Discontinue normal saline infusion.  If persistent would recommend low dose amlodipine 2.5 mg daily.  She has stopped nicotine and Juul.    LOS: 5 Estanislado EmmsLori C Marcelline Temkin 11/25/2018  8:07 AM

## 2018-11-25 NOTE — Progress Notes (Addendum)
PROGRESS NOTE  Carla Gross KLK:[id number] DOB: 1995/08/20 DOA: 11/20/2018 PCP: Patient, No Pcp Per   Brief History: 23 year old female with no documented chronic medical problems presented with altered mental status. Apparently, the patient had been drinking "White [place]" seltzeron 11/19/2018. On the following day, the patient was noted to have nausea and vomiting with associated urinary incontinence. The patient herself has not had any recollection/memories for the past week. The patient is a Electronics engineer at [employer]. The last thing she remembered was slightly over 1 week ago still being at [place] to come home. She states that up until the morning of 11/22/2018 she was still "out of it". She denied any fevers, chills, chest pain, shortness of breath, headache, neck pain, abdominal pain. Lumbar puncture was performed on 11/20/2018 was unremarkable for infectious process. Culture was negative and WBC was 2. Initial CT of the brain was negative. The patient herself states that she uses marijuana 3-4 times daily. She denies any other illicit drug use. Urine drug screen was positive for THC at the time of admission.  Assessment/Plan: Acute toxic encephalopathy -Secondary to postictal state andSubstance ingestion -Patient is back to baseline -Urinalysis negative for pyuria -Blood cultures and CSF cultures negative  Rhabdomyolysis/Myopathy -CPK peaked 32K-->>starting to trend down today 23K -saline locked -6/18--spoke with neuromuscular specialist-->check myositis panel, ESR, CRP, coxsackie antibodies -CRP 2.1 -ESR 45 -may need muscle biopsy if does not improve -remarkably no muscle weakness or pain  Elevated BP -if remains elevated off nicotine/JUUL-->start amlodpine  Acute kidney injury -Secondary to rhabdomyolysis -Appreciate nephrology follow-up -continue bicarbonate drip -no hydronephrosis on abd [place] -continues to improve -serum  creatinine peaked 3.99  Seizure disorder -11/21/2018 EEG--negative for epileptiform discharges -Suspect this is drug-induced seizure -I spoke with epileptology, Dr. Nolen Mu keppra and follow up in office -MR brain--cannot obtain due to body piercings  Tongue laceration -Local care -try viscous lidocaine and magic mouthwash  Transaminasemia -elevation in part due to rhabdo as muscle also produces AST and ALT -abd ultrasound neg -HIV neg -AMA, ANA, ASMA--neg -alpha 1 antitripysin--neg     Disposition Plan: Home when CKs start improving  Family Communication: Family at bedside  Consultants:renal  Code Status: FULL   DVT Prophylaxis: [place] Heparin / [place] Lovenox   Procedures: As Listed in Progress Note Above  Antibiotics: None   Subjective: Patient denies fevers, chills, headache, chest pain, dyspnea, nausea, vomiting, diarrhea, abdominal pain, dysuria, hematuria, hematochezia, and melena. Denies myalgias or muscle weakness  Objective: Vitals:   11/24/18 2113 11/24/18 2116 11/24/18 2120 11/25/18 0537  BP:  (!) 158/101 (!) 136/101 (!) 156/96  Pulse: 81 (!) 55 (!) 59 (!) 46  Resp:  '18 18 16  ' Temp:  98.7 F (37.1 C)  98.3 F (36.8 C)  TempSrc:  Oral  Oral  SpO2: 96% 100% 98% 100%  Weight:    75.5 kg  Height:        Intake/Output Summary (Last 24 hours) at 11/25/2018 1743 Last data filed at 11/25/2018 0524 Gross per 24 hour  Intake 2014.85 ml  Output --  Net 2014.85 ml   Weight change: 1 kg Exam:   General:  Pt is alert, follows commands appropriately, not in acute distress  HEENT: No icterus, No thrush, No neck mass, [place]/AT  Cardiovascular: RRR, S1/S2, no rubs, no gallops  Respiratory: CTA bilaterally, no wheezing, no crackles, no rhonchi  Abdomen: Soft/+BS, non tender, non distended, no guarding  Extremities: No edema, No lymphangitis, No petechiae, No rashes, no synovitis   Data Reviewed: I have personally reviewed  following labs and imaging studies Basic Metabolic Panel: Recent Labs  Lab 11/20/18 1949 11/21/18 0413 11/22/18 0443 11/23/18 0514 11/24/18 0542 11/25/18 0557  NA  --  141 141 141 142 142  K  --  4.5 3.7 3.5 4.0 3.7  CL  --  116* 114* 109 108 108  CO2  --  17* 21* '24 22 24  ' GLUCOSE  --  127* 111* 114* 96 96  BUN  --  31* 33* 24* 14 11  CREATININE  --  3.30* 3.99* 2.88* 2.14* 1.61*  CALCIUM  --  8.3* 8.3* 8.4* 8.6* 8.6*  MG 2.8*  --   --   --   --   --    Liver Function Tests: Recent Labs  Lab 11/21/18 0413 11/22/18 0443 11/23/18 0514 11/24/18 0542 11/25/18 0557  AST 111* 162* 256* 324* 282*  ALT 34 57* 105* 142* 155*  ALKPHOS 40 38 41 42 43  BILITOT 1.3* 1.3* 1.2 0.9 1.1  PROT 6.0* 5.6* 6.0* 5.9* 6.3*  ALBUMIN 3.4* 3.1* 3.2* 3.1* 3.2*   No results for input(s): LIPASE, AMYLASE in the last 168 hours. No results for input(s): AMMONIA in the last 168 hours. Coagulation Profile: No results for input(s): INR, PROTIME in the last 168 hours. CBC: Recent Labs  Lab 11/20/18 1658 11/21/18 0413 11/22/18 0443 11/23/18 0514 11/25/18 0557  WBC 24.5* 13.9* 10.4 7.1 6.1  NEUTROABS 22.5*  --   --   --  3.6  HGB 14.3 11.4* 10.7* 11.2* 11.3*  HCT 42.0 34.3* 33.0* 33.5* 33.6*  MCV 88.4 90.3 91.2 88.2 88.9  PLT 241 146* 128* 171 193   Cardiac Enzymes: Recent Labs  Lab 11/21/18 0413 11/22/18 0443 11/23/18 0514 11/24/18 0542 11/25/18 0557  CKTOTAL 8,074* 11,990* 26,928* 32,362* 23,067*   BNP: Invalid input(s): POCBNP CBG: Recent Labs  Lab 11/23/18 1108 11/23/18 1606 11/24/18 0000 11/24/18 0537 11/24/18 1146  GLUCAP 106* 99 104* 102* 84   HbA1C: No results for input(s): HGBA1C in the last 72 hours. Urine analysis:    Component Value Date/Time   COLORURINE STRAW (A) 11/22/2018 1021   APPEARANCEUR CLEAR 11/22/2018 1021   LABSPEC 1.006 11/22/2018 1021   PHURINE 5.0 11/22/2018 1021   GLUCOSEU NEGATIVE 11/22/2018 1021   HGBUR LARGE (A) 11/22/2018 1021    BILIRUBINUR NEGATIVE 11/22/2018 1021   KETONESUR NEGATIVE 11/22/2018 1021   PROTEINUR NEGATIVE 11/22/2018 1021   NITRITE NEGATIVE 11/22/2018 1021   LEUKOCYTESUR NEGATIVE 11/22/2018 1021   Sepsis Labs: '@LABRCNTIP' (procalcitonin:4,lacticidven:4) ) Recent Results (from the past 240 hour(s))  Blood culture (routine x 2)     Status: None   Collection Time: 11/20/18  6:00 PM   Specimen: BLOOD LEFT ARM  Result Value Ref Range Status   Specimen Description BLOOD LEFT ARM  Final   Special Requests   Final    BOTTLES DRAWN AEROBIC AND ANAEROBIC Blood Culture adequate volume   Culture   Final    NO GROWTH 5 DAYS Performed at [facility], [address] [place], [place] [address]    Report Status 11/25/2018 FINAL  Final  Urine culture     Status: None   Collection Time: 11/20/18  6:10 PM   Specimen: Urine, Catheterized  Result Value Ref Range Status   Specimen Description   Final    URINE, CATHETERIZED Performed at [facility], [address], [place],  [place] [address]    Special Requests   Final    NONE Performed at [facility], [address], [place], [place] [address]    Culture   Final    NO GROWTH Performed at [facility], [place] [address], [place], [place] [address]    Report Status 11/22/2018 FINAL  Final  Blood culture (routine x 2)     Status: None   Collection Time: 11/20/18  6:17 PM   Specimen: BLOOD RIGHT HAND  Result Value Ref Range Status   Specimen Description BLOOD RIGHT HAND  Final   Special Requests   Final    BOTTLES DRAWN AEROBIC AND ANAEROBIC Blood Culture adequate volume   Culture   Final    NO GROWTH 5 DAYS Performed at [facility], [address], [place], [place] [address]    Report Status 11/25/2018 FINAL  Final  Novel Coronavirus,NAA,(SEND-OUT TO REF LAB - [place] 24-48 hrs); Hosp Order     Status: None   Collection Time: 11/20/18  7:13 PM   Specimen: Oropharyngeal swab; Respiratory  Result Value Ref Range Status   SARS-CoV-2, NAA NOT DETECTED  NOT DETECTED Final    Comment: (NOTE) This test was developed and its performance characteristics determined by [employer]. This test has not been FDA cleared or approved. This test has been authorized by FDA under an Emergency Use Authorization (EUA). This test is only authorized for the duration of time the declaration that circumstances exist justifying the authorization of the emergency use of in vitro diagnostic tests for detection of SARS-CoV-2 virus and/or diagnosis of COVID-19 infection under section 564(b)(1) of the Act, 21 U.S.C. 010XNA-3(F)(5), unless the authorization is terminated or revoked sooner. When diagnostic testing is negative, the possibility of a false negative result should be considered in the context of a patient's recent exposures and the presence of clinical signs and symptoms consistent with COVID-19. An individual without symptoms of COVID-19 and who is not shedding SARS-CoV-2 virus would expect to have a negative (not detected) result in this assay. Performed  At: [facility] [address] [place], [place] [id number] Rush Farmer MD HC:[id number]    [place]  Final    Comment: Performed at [facility], [address], [place], [place] [address]  CSF culture     Status: None   Collection Time: 11/20/18  7:13 PM   Specimen: CSF; Cerebrospinal Fluid  Result Value Ref Range Status   Specimen Description   Final    CSF Performed at [facility], [address] [place], [place] [address]    Special Requests   Final    NONE Performed at [facility], [address], [place], [place] [address]    Gram Stain   Final    NO CELLS OR ORGANISMS SEEN Gram Stain Report Called to,Read Back By and Verified With: NICHOLS,K. AT 2011 ON 11/20/2018 BY EVA Performed at [facility], [address] [place], [place] [address]    Culture   Final    NO GROWTH 3 DAYS Performed at [facility], [place] [address]  [place], [place] [address]    Report Status 11/24/2018 FINAL  Final  MRSA PCR Screening     Status: None   Collection Time: 11/20/18 10:05 PM   Specimen: Nasal Mucosa; Nasopharyngeal  Result Value Ref Range Status   MRSA by PCR NEGATIVE NEGATIVE Final    Comment:        The GeneXpert MRSA Assay (FDA approved  for NASAL specimens only), is one component of a comprehensive MRSA colonization surveillance program. It is not intended to diagnose MRSA infection nor to guide or monitor treatment for MRSA infections. Performed at [facility], [address] [place], [place] [address]      Scheduled Meds:  Chlorhexidine Gluconate Cloth  6 each Topical Daily   levETIRAcetam  500 mg Oral BID   Continuous Infusions:  Procedures/Studies: Ct Head Wo Contrast  Result Date: 11/20/2018 CLINICAL DATA:  Per ED note. Pt brought in by ems after family called for possible seizure. Family reports she drank a few white claws yesterday by the pool and got sick. Pt had multiple episodes of urinating on self with vomiting. Today had a possible seizure per ems and family gave cbd oil under tongue. EXAM: CT HEAD WITHOUT CONTRAST TECHNIQUE: Contiguous axial images were obtained from the base of the skull through the vertex without intravenous contrast. COMPARISON:  None. FINDINGS: Brain: No evidence of acute infarction, hemorrhage, hydrocephalus, extra-axial collection or mass lesion/mass effect. Vascular: No hyperdense vessel or unexpected calcification. Skull: Normal. Negative for fracture or focal lesion. Sinuses/Orbits: Globes and orbits are unremarkable. Mucous retention cyst in the left sphenoid sinus. Mild, right greater than left, polypoid mucosal thickening in the inferior maxillary sinuses. Clear mastoid air cells. Other: None. IMPRESSION: 1. No intracranial abnormality. 2. Sinus mucosal thickening as described. Electronically Signed   By: Lajean Manes M.D.   On: 11/20/2018 17:57   US Abdomen  Complete  Result Date: 11/22/2018 CLINICAL DATA:  23 year old female with a history of increased creatinine and liver failure EXAM: ABDOMEN ULTRASOUND COMPLETE COMPARISON:  None. FINDINGS: Gallbladder: No gallstones or wall thickening visualized. No sonographic Murphy sign noted by sonographer. Common bile duct: Diameter: 2 mm-3 mm Liver: There is a small hyperechoic rounded focus within the right liver measuring 6 mm. Unremarkable echotexture of the liver. IVC: No abnormality visualized. Pancreas: Visualized portion unremarkable. Spleen: Size and appearance within normal limits. Right Kidney: Length: 10.8 cm. Echogenicity within normal limits. No mass or hydronephrosis visualized. Fluid versus adrenal gland at the superior cortex of the right kidney. Left Kidney: Length: 11.1 cm. Echogenicity within normal limits. No mass or hydronephrosis visualized. Abdominal aorta: No aneurysm visualized. Other findings: None. IMPRESSION: Sonographic survey of the abdomen demonstrates no acute finding. No hydronephrosis. There is a 6 mm hyperechoic focus of the right liver which most likely represents a hemangioma in a patient of this age. Electronically Signed   By: Corrie Mckusick D.O.   On: 11/22/2018 13:20   Dg Chest Portable 1 View  Result Date: 11/20/2018 CLINICAL DATA:  Altered mental status. EXAM: PORTABLE CHEST 1 VIEW COMPARISON:  None. FINDINGS: Normal heart, mediastinum and hila. Clear lungs. No evidence of a pleural effusion. No gross pneumothorax on this supine exam. Skeletal structures are unremarkable. IMPRESSION: No active disease. Electronically Signed   By: Lajean Manes M.D.   On: 11/20/2018 17:58    Orson Eva, DO  [facility] Pager [phone]  If 7PM-7AM, please contact night-coverage [email or web address] Password [email or web address] 11/25/2018, 5:43 PM   LOS: 5 days

## 2018-11-26 LAB — COMPREHENSIVE METABOLIC PANEL
ALT: 149 U/L — ABNORMAL HIGH (ref 0–44)
AST: 213 U/L — ABNORMAL HIGH (ref 15–41)
Albumin: 3.1 g/dL — ABNORMAL LOW (ref 3.5–5.0)
Alkaline Phosphatase: 45 U/L (ref 38–126)
Anion gap: 11 (ref 5–15)
BUN: 9 mg/dL (ref 6–20)
CO2: 24 mmol/L (ref 22–32)
Calcium: 8.6 mg/dL — ABNORMAL LOW (ref 8.9–10.3)
Chloride: 105 mmol/L (ref 98–111)
Creatinine, Ser: 1.27 mg/dL — ABNORMAL HIGH (ref 0.44–1.00)
GFR calc Af Amer: >60 mL/min (ref >60)
GFR calc non Af Amer: 59 mL/min — ABNORMAL LOW (ref >60)
Glucose, Bld: 90 mg/dL (ref 70–99)
Potassium: 3.5 mmol/L (ref 3.5–5.1)
Sodium: 140 mmol/L (ref 135–145)
Total Bilirubin: 1 mg/dL (ref 0.3–1.2)
Total Protein: 5.9 g/dL — ABNORMAL LOW (ref 6.5–8.1)

## 2018-11-26 LAB — ALDOLASE: Aldolase: <1.2 U/L — ABNORMAL LOW (ref 3.3–10.3)

## 2018-11-26 LAB — ANTI-JO 1 ANTIBODY, IGG: Anti JO-1: <0.2 AI (ref 0.0–0.9)

## 2018-11-26 LAB — CK: Total CK: 12315 U/L — ABNORMAL HIGH (ref 38–234)

## 2018-11-26 LAB — HEPATITIS B SURFACE ANTIGEN: Hepatitis B Surface Ag: NEGATIVE

## 2018-11-26 LAB — COMPLEMENT, TOTAL: Compl, Total (CH50): >60 U/mL (ref >41)

## 2018-11-26 LAB — HEPATITIS C ANTIBODY: HCV Ab: <0.1 s/co ratio (ref 0.0–0.9)

## 2018-11-26 MED ORDER — LEVETIRACETAM 500 MG PO TABS: 500.0000 mg | ORAL_TABLET | Freq: Two times a day (BID) | ORAL | 2 refills | Status: DC

## 2018-11-26 MED ORDER — LIDOCAINE VISCOUS HCL 2 % MT SOLN: 15.0000 mL | OROMUCOSAL | 0 refills | Status: AC | PRN

## 2018-11-26 NOTE — Progress Notes (Signed)
[facility] ROUNDING NOTE   Subjective:    HPI/subjective:  Remote visit.  Cr, CK, downtrending.  UOP increasing off IVFs.     Background:  This is a pleasant 23 year old lady that was admitted 11/20/2018 following a seizure episode brought to the emergency room she been drinking binge drinking alcohol and using CBD oil.  She has developed acute kidney injury with an elevated CPK of nearly 12,000.  Ultrasound of liver showed a 6 mm hyperechoic focus most likely representing hemangioma.  Renal size was normal with no hydronephrosis visualized    Objective:  Vital signs in last 24 hours:  Temp:  [98.4 F (36.9 C)-98.5 F (36.9 C)] 98.5 F (36.9 C) (06/20 1343) Pulse Rate:  [66-100] 66 (06/20 1343) Resp:  [16-18] 18 (06/20 1343) BP: (130-160)/(97-108) 160/107 (06/20 1343) SpO2:  [97 %-100 %] 97 % (06/20 1343) Weight:  [75.5 kg] 75.5 kg (06/20 0526)  Weight change: 0 kg Filed Weights   11/24/18 0500 11/25/18 0537 11/26/18 0526  Weight: 74.5 kg 75.5 kg 75.5 kg    Intake/Output: I/O last 3 completed shifts: In: 2254.9 [P.O.:480; I.V.:1774.9] Out: 800 [Urine:800]   Intake/Output this shift:  Total I/O In: 820 [P.O.:720; Other:100] Out: 1000 [Urine:1000]     Basic Metabolic Panel: Recent Labs  Lab 11/20/18 1949  11/22/18 0443 11/23/18 0514 11/24/18 0542 11/25/18 0557 11/26/18 0626  NA  --    < > 141 141 142 142 140  K  --    < > 3.7 3.5 4.0 3.7 3.5  CL  --    < > 114* 109 108 108 105  CO2  --    < > 21* 24 22 24 24   GLUCOSE  --    < > 111* 114* 96 96 90  BUN  --    < > 33* 24* 14 11 9   CREATININE  --    < > 3.99* 2.88* 2.14* 1.61* 1.27*  CALCIUM  --    < > 8.3* 8.4* 8.6* 8.6* 8.6*  MG 2.8*  --   --   --   --   --   --    < > = values in this interval not displayed.    Liver Function Tests: Recent Labs  Lab 11/22/18 0443 11/23/18 0514 11/24/18 0542 11/25/18 0557 11/26/18 0626  AST 162* 256* 324* 282* 213*  ALT 57* 105* 142* 155* 149*   ALKPHOS 38 41 42 43 45  BILITOT 1.3* 1.2 0.9 1.1 1.0  PROT 5.6* 6.0* 5.9* 6.3* 5.9*  ALBUMIN 3.1* 3.2* 3.1* 3.2* 3.1*    CBC: Recent Labs  Lab 11/20/18 1658 11/21/18 0413 11/22/18 0443 11/23/18 0514 11/25/18 0557  WBC 24.5* 13.9* 10.4 7.1 6.1  NEUTROABS 22.5*  --   --   --  3.6  HGB 14.3 11.4* 10.7* 11.2* 11.3*  HCT 42.0 34.3* 33.0* 33.5* 33.6*  MCV 88.4 90.3 91.2 88.2 88.9  PLT 241 146* 128* 171 193    Cardiac Enzymes: Recent Labs  Lab 11/22/18 0443 11/23/18 0514 11/24/18 0542 11/25/18 0557 11/26/18 0626  CKTOTAL 11,990* 26,928* 32,362* 23,067* 12,315*    CBG: Recent Labs  Lab 11/23/18 1108 11/23/18 1606 11/24/18 0000 11/24/18 0537 11/24/18 1146  GLUCAP 106* 99 104* 102* 66    Microbiology: Results for orders placed or performed during the hospital encounter of 11/20/18  Blood culture (routine x 2)     Status: None   Collection Time: 11/20/18  6:00 PM   Specimen: BLOOD  LEFT ARM  Result Value Ref Range Status   Specimen Description BLOOD LEFT ARM  Final   Special Requests   Final    BOTTLES DRAWN AEROBIC AND ANAEROBIC Blood Culture adequate volume   Culture   Final    NO GROWTH 5 DAYS Performed at [facility], [address] [place], [place] [address]    Report Status 11/25/2018 FINAL  Final  Urine culture     Status: None   Collection Time: 11/20/18  6:10 PM   Specimen: Urine, Catheterized  Result Value Ref Range Status   Specimen Description   Final    URINE, CATHETERIZED Performed at [facility], [address] [place], [place] [address]    Special Requests   Final    NONE Performed at [facility], [address] [place], [place] [address]    Culture   Final    NO GROWTH Performed at [facility], [address] [place], [place] [address]    Report Status 11/22/2018 FINAL  Final  Blood culture (routine x 2)     Status: None   Collection Time: 11/20/18  6:17 PM   Specimen: BLOOD RIGHT HAND  Result Value Ref Range Status    Specimen Description BLOOD RIGHT HAND  Final   Special Requests   Final    BOTTLES DRAWN AEROBIC AND ANAEROBIC Blood Culture adequate volume   Culture   Final    NO GROWTH 5 DAYS Performed at [facility], [address] [place], [place] [address]    Report Status 11/25/2018 FINAL  Final  Novel Coronavirus,NAA,(SEND-OUT TO REF LAB - TAT 24-48 hrs); Hosp Order     Status: None   Collection Time: 11/20/18  7:13 PM   Specimen: Oropharyngeal swab; Respiratory  Result Value Ref Range Status   SARS-CoV-2, NAA NOT DETECTED NOT DETECTED Final    Comment: (NOTE) This test was developed and its performance characteristics determined by [facility]. This test has not been FDA cleared or approved. This test has been authorized by FDA under an Emergency Use Authorization (EUA). This test is only authorized for the duration of time the declaration that circumstances exist justifying the authorization of the emergency use of in vitro diagnostic tests for detection of SARS-CoV-2 virus and/or diagnosis of COVID-19 infection under section 564(b)(1) of the Act, 21 U.S.C. 130QMV-7(Q)(4360bbb-3(b)(1), unless the authorization is terminated or revoked sooner. When diagnostic testing is negative, the possibility of a false negative result should be considered in the context of a patient's recent exposures and the presence of clinical signs and symptoms consistent with COVID-19. An individual without symptoms of COVID-19 and who is not shedding SARS-CoV-2 virus would expect to have a negative (not detected) result in this assay. Performed  At: [facility] [address] [place], [place] [id number] Jolene SchimkeNagendra Sanjai MD XL:[id number]:[phone]    Coronavirus Source NASOPHARYNGEAL  Final    Comment: Performed at [facility], [address] [place], [place] [address]  CSF culture     Status: None   Collection Time: 11/20/18  7:13 PM   Specimen: CSF; Cerebrospinal Fluid  Result Value Ref Range Status   Specimen  Description   Final    CSF Performed at [facility], [address] [place], [place] [address]    Special Requests   Final    NONE Performed at [facility], [address] [place], [place] [address]    Gram Stain   Final    NO CELLS OR ORGANISMS SEEN  Gram Stain Report Called to,Read Back By and Verified With: NICHOLS,K. AT 2011 ON 11/20/2018 BY EVA Performed at [facility], [address] [place], [place] [address]    Culture   Final    NO GROWTH 3 DAYS Performed at [facility], [address] [place], [place] [address]    Report Status 11/24/2018 FINAL  Final  MRSA PCR Screening     Status: None   Collection Time: 11/20/18 10:05 PM   Specimen: Nasal Mucosa; Nasopharyngeal  Result Value Ref Range Status   MRSA by PCR NEGATIVE NEGATIVE Final    Comment:        The GeneXpert MRSA Assay (FDA approved for NASAL specimens only), is one component of a comprehensive MRSA colonization surveillance program. It is not intended to diagnose MRSA infection nor to guide or monitor treatment for MRSA infections. Performed at [facility], [address] [place], [place] [address]   Urinalysis: No results for input(s): COLORURINE, LABSPEC, PHURINE, GLUCOSEU, HGBUR, BILIRUBINUR, KETONESUR, PROTEINUR, UROBILINOGEN, NITRITE, LEUKOCYTESUR in the last 72 hours.  Invalid input(s): APPERANCEUR    Imaging: No results found.   Medications:    . Chlorhexidine Gluconate Cloth  6 each Topical Daily  . levETIRAcetam  500 mg Oral BID  . nicotine  21 mg Transdermal Daily   acetaminophen **OR** acetaminophen, lidocaine, LORazepam, magic mouthwash, prochlorperazine  Assessment/ Plan:   Acute kidney injury secondary to rhabdomyolysis secondary to seizure.  Classical findings of dipstick positive blood no RBCs per high-powered film.  Cr peaked at 3.99. Renal ultrasound no acute findings or hydro. Resolving with supportive care.  CPK peaked at 32,000 and now downtrending.  Can  discontinue normal saline infusion.  Expect renal function to return to baseline.  We will sign off.  If pt has residual CKD/ proteinuria, can be referred to us as OP.  Metabolic acidosis resolved.   Anemia mild and stable  Polysubstance abuse encouraged cessation and counseling  Transaminitis - AST appears to have peaked.  Per primary team.  Work-up thus far includes ANA (neg), anti-mitochondrial ab neg, anti-smooth muscle neg, and alpha1 anti-trypsin neg  Seizure disorder - per primary team   HTN - she is s/p large volume fluid resuscitation for rhabdo and salt load may be contributing.  Discontinue normal saline infusion.  If persistent would recommend low dose amlodipine 2.5 mg daily.  She has stopped nicotine and Juul.    LOS: 6 Aerik Polan 11/26/2018  2:17 PM

## 2018-11-26 NOTE — Progress Notes (Signed)
Patient returned from walk around floor stating " I just want to leave" . I offered patient a change of scenery (ie. Waiting room), magazines to read, to talk. Patient became teary eyed and refused intervention. Nicotine patch provided prior to walk. Further antianxiety was offered and  refused. Nursing continuing to maintain contact to access any further needs. Elodia Florence RN

## 2018-11-26 NOTE — Progress Notes (Signed)
Patient received discharge instructions and paperwork. All questions answered nd patient stated her understanding. Denied any further needs.All personal items accounted for. Escorted to family in short stay.  Elodia Florence RN 11/26/2018 1732

## 2018-11-26 NOTE — Progress Notes (Signed)
Critical CK result received from lab: 20,233 (was 23,067 on 6/19). On call provider notified, no new orders at this time.

## 2018-11-26 NOTE — Progress Notes (Signed)
Received okay to discontinue patient's telemetry from MD. Patient alert and oriented, vitals stable.

## 2018-11-26 NOTE — Discharge Summary (Signed)
Physician Discharge Summary  RHIANNA RAULERSON XJO:[id number] DOB: 1996-01-24 DOA: 11/20/2018  PCP: Patient, No Pcp Per  Admit date: 11/20/2018 Discharge date: 11/26/2018  Admitted From: Home Disposition:  Home   Recommendations for Outpatient Follow-up:  1. Follow up with PCP in 1-2 weeks 2. Please obtain BMP/CBC and CPK in one week    Discharge Condition: Stable CODE STATUS: FULL Diet recommendation:  Regular   Brief/Interim Summary: 23 year old female with no documented chronic medical problems presented with altered mental status. Apparently, the patient had been drinking "White Claw" seltzeron 11/19/2018. On the following day, the patient was noted to have nausea and vomiting with associated urinary incontinence. The patient herself has not had any recollection/memories for the past week. The patient is a Electronics engineer at [employer]. The last thing she remembered was slightly over 1 week ago still being at [employer] to come home. She states that up until the morning of 11/22/2018 she was still "out of it". She denied any fevers, chills, chest pain, shortness of breath, headache, neck pain, abdominal pain. Lumbar puncture was performed on 11/20/2018 was unremarkable for infectious process. Culture was negative and WBC was 2. Initial CT of the brain was negative. The patient herself states that she uses marijuana 3-4 times daily. She denies any other illicit drug use. Urine drug screen was positive for THC at the time of admission.  Discharge Diagnoses:  Acute toxic encephalopathy -Secondary to postictal state andSubstance ingestion -Patient is back to baseline -Urinalysis negative for pyuria -Blood cultures and CSF cultures negative  Rhabdomyolysis/Myopathy -CPK peaked 32K-->>starting to trend down today 23K -saline locked -6/18--spoke with neuromuscular specialist-->check myositis panel, ESR, CRP, coxsackie antibodies -Aldolase <1.2 -CRP 2.1 -ESR  45 -may need muscle biopsy if does not improve -remarkably no muscle weakness or pain -pt cleared for d/c by renal -encouraged pt to increase po water intake and avoid strenuous activity -requested f/u with neuromuscular MD, Dr. Posey Pronto after d/c  Elevated BP -if remains elevated off nicotine/JUUL-->start amlodpine  Acute kidney injury -Secondary to rhabdomyolysis -Appreciate nephrology follow-up -started on  bicarbonate drip initially -no hydronephrosis on abd [place] -continues to improve -serum creatinine peaked 3.99 -serum creatinine 1.27 on day of d/c  Seizure disorder -11/21/2018 EEG--negative for epileptiform discharges -Suspect this is drug-induced seizure -I spoke with epileptology, Dr. Nolen Mu keppra and follow up in office -MR brain--cannot obtain due to body piercings  Tongue laceration -Local care -try viscous lidocaine and magic mouthwash  Transaminasemia -elevation in part due to rhabdo as muscle also produces AST and ALT -abd ultrasound neg -HIV neg -AMA, ANA, ASMA--neg -alpha 1 antitripysin--neg  Discharge Instructions  Discharge Instructions    Ambulatory referral to Neurology   Complete by: As directed    Rhabdomyolysis; myopathy   Ambulatory referral to Neurology   Complete by: As directed    An appointment is requested in approximately: 1 month for seizure     Allergies as of 11/26/2018   No Known Allergies     Medication List    TAKE these medications   levETIRAcetam 500 MG tablet Commonly known as: KEPPRA Take 1 tablet (500 mg total) by mouth 2 (two) times daily.   lidocaine 2 % solution Commonly known as: XYLOCAINE Use as directed 15 mLs in the mouth or throat every 4 (four) hours as needed for mouth pain.   Lo Loestrin Fe 1 MG-10 MCG / 10 MCG tablet Generic drug: Norethindrone-Ethinyl Estradiol-Fe Biphas Take 1 tablet by mouth daily.  No Known Allergies  Consultations:  renal   Procedures/Studies: Ct Head  Wo Contrast  Result Date: 11/20/2018 CLINICAL DATA:  Per ED note. Pt brought in by ems after family called for possible seizure. Family reports she drank a few white claws yesterday by the pool and got sick. Pt had multiple episodes of urinating on self with vomiting. Today had a possible seizure per ems and family gave cbd oil under tongue. EXAM: CT HEAD WITHOUT CONTRAST TECHNIQUE: Contiguous axial images were obtained from the base of the skull through the vertex without intravenous contrast. COMPARISON:  None. FINDINGS: Brain: No evidence of acute infarction, hemorrhage, hydrocephalus, extra-axial collection or mass lesion/mass effect. Vascular: No hyperdense vessel or unexpected calcification. Skull: Normal. Negative for fracture or focal lesion. Sinuses/Orbits: Globes and orbits are unremarkable. Mucous retention cyst in the left sphenoid sinus. Mild, right greater than left, polypoid mucosal thickening in the inferior maxillary sinuses. Clear mastoid air cells. Other: None. IMPRESSION: 1. No intracranial abnormality. 2. Sinus mucosal thickening as described. Electronically Signed   By: Lajean Manes M.D.   On: 11/20/2018 17:57   US Abdomen Complete  Result Date: 11/22/2018 CLINICAL DATA:  23 year old female with a history of increased creatinine and liver failure EXAM: ABDOMEN ULTRASOUND COMPLETE COMPARISON:  None. FINDINGS: Gallbladder: No gallstones or wall thickening visualized. No sonographic Murphy sign noted by sonographer. Common bile duct: Diameter: 2 mm-3 mm Liver: There is a small hyperechoic rounded focus within the right liver measuring 6 mm. Unremarkable echotexture of the liver. IVC: No abnormality visualized. Pancreas: Visualized portion unremarkable. Spleen: Size and appearance within normal limits. Right Kidney: Length: 10.8 cm. Echogenicity within normal limits. No mass or hydronephrosis visualized. Fluid versus adrenal gland at the superior cortex of the right kidney. Left Kidney:  Length: 11.1 cm. Echogenicity within normal limits. No mass or hydronephrosis visualized. Abdominal aorta: No aneurysm visualized. Other findings: None. IMPRESSION: Sonographic survey of the abdomen demonstrates no acute finding. No hydronephrosis. There is a 6 mm hyperechoic focus of the right liver which most likely represents a hemangioma in a patient of this age. Electronically Signed   By: Corrie Mckusick D.O.   On: 11/22/2018 13:20   Dg Chest Portable 1 View  Result Date: 11/20/2018 CLINICAL DATA:  Altered mental status. EXAM: PORTABLE CHEST 1 VIEW COMPARISON:  None. FINDINGS: Normal heart, mediastinum and hila. Clear lungs. No evidence of a pleural effusion. No gross pneumothorax on this supine exam. Skeletal structures are unremarkable. IMPRESSION: No active disease. Electronically Signed   By: Lajean Manes M.D.   On: 11/20/2018 17:58         Discharge Exam: Vitals:   11/26/18 1343 11/26/18 1448  BP: (!) 160/107 (!) 130/102  Pulse: 66 (!) 110  Resp: 18 20  Temp: 98.5 F (36.9 C) 98.5 F (36.9 C)  SpO2: 97% 100%   Vitals:   11/25/18 2130 11/26/18 0526 11/26/18 1343 11/26/18 1448  BP: (!) 130/97 (!) 144/108 (!) 160/107 (!) 130/102  Pulse: 100 77 66 (!) 110  Resp: '16 18 18 20  ' Temp: 98.4 F (36.9 C) 98.5 F (36.9 C) 98.5 F (36.9 C) 98.5 F (36.9 C)  TempSrc: Oral Oral    SpO2: 100% 99% 97% 100%  Weight:  75.5 kg    Height:        General: Pt is alert, awake, not in acute distress Cardiovascular: RRR, S1/S2 +, no rubs, no gallops Respiratory: CTA bilaterally, no wheezing, no rhonchi Abdominal: Soft, NT, ND, bowel sounds +  Extremities: no edema, no cyanosis   The results of significant diagnostics from this hospitalization (including imaging, microbiology, ancillary and laboratory) are listed below for reference.    Significant Diagnostic Studies: Ct Head Wo Contrast  Result Date: 11/20/2018 CLINICAL DATA:  Per ED note. Pt brought in by ems after family called  for possible seizure. Family reports she drank a few white claws yesterday by the pool and got sick. Pt had multiple episodes of urinating on self with vomiting. Today had a possible seizure per ems and family gave cbd oil under tongue. EXAM: CT HEAD WITHOUT CONTRAST TECHNIQUE: Contiguous axial images were obtained from the base of the skull through the vertex without intravenous contrast. COMPARISON:  None. FINDINGS: Brain: No evidence of acute infarction, hemorrhage, hydrocephalus, extra-axial collection or mass lesion/mass effect. Vascular: No hyperdense vessel or unexpected calcification. Skull: Normal. Negative for fracture or focal lesion. Sinuses/Orbits: Globes and orbits are unremarkable. Mucous retention cyst in the left sphenoid sinus. Mild, right greater than left, polypoid mucosal thickening in the inferior maxillary sinuses. Clear mastoid air cells. Other: None. IMPRESSION: 1. No intracranial abnormality. 2. Sinus mucosal thickening as described. Electronically Signed   By: Lajean Manes M.D.   On: 11/20/2018 17:57   US Abdomen Complete  Result Date: 11/22/2018 CLINICAL DATA:  23 year old female with a history of increased creatinine and liver failure EXAM: ABDOMEN ULTRASOUND COMPLETE COMPARISON:  None. FINDINGS: Gallbladder: No gallstones or wall thickening visualized. No sonographic Murphy sign noted by sonographer. Common bile duct: Diameter: 2 mm-3 mm Liver: There is a small hyperechoic rounded focus within the right liver measuring 6 mm. Unremarkable echotexture of the liver. IVC: No abnormality visualized. Pancreas: Visualized portion unremarkable. Spleen: Size and appearance within normal limits. Right Kidney: Length: 10.8 cm. Echogenicity within normal limits. No mass or hydronephrosis visualized. Fluid versus adrenal gland at the superior cortex of the right kidney. Left Kidney: Length: 11.1 cm. Echogenicity within normal limits. No mass or hydronephrosis visualized. Abdominal aorta: No  aneurysm visualized. Other findings: None. IMPRESSION: Sonographic survey of the abdomen demonstrates no acute finding. No hydronephrosis. There is a 6 mm hyperechoic focus of the right liver which most likely represents a hemangioma in a patient of this age. Electronically Signed   By: Corrie Mckusick D.O.   On: 11/22/2018 13:20   Dg Chest Portable 1 View  Result Date: 11/20/2018 CLINICAL DATA:  Altered mental status. EXAM: PORTABLE CHEST 1 VIEW COMPARISON:  None. FINDINGS: Normal heart, mediastinum and hila. Clear lungs. No evidence of a pleural effusion. No gross pneumothorax on this supine exam. Skeletal structures are unremarkable. IMPRESSION: No active disease. Electronically Signed   By: Lajean Manes M.D.   On: 11/20/2018 17:58     Microbiology: Recent Results (from the past 240 hour(s))  Blood culture (routine x 2)     Status: None   Collection Time: 11/20/18  6:00 PM   Specimen: BLOOD LEFT ARM  Result Value Ref Range Status   Specimen Description BLOOD LEFT ARM  Final   Special Requests   Final    BOTTLES DRAWN AEROBIC AND ANAEROBIC Blood Culture adequate volume   Culture   Final    NO GROWTH 5 DAYS Performed at [facility], [address] [place], [place] [address]    Report Status 11/25/2018 FINAL  Final  Urine culture     Status: None   Collection Time: 11/20/18  6:10 PM   Specimen: Urine, Catheterized  Result Value Ref Range Status   Specimen  Description   Final    URINE, CATHETERIZED Performed at [facility], [address] [place], [place] [address]    Special Requests   Final    NONE Performed at [facility], [address], [place], [place] [address]    Culture   Final    NO GROWTH Performed at [facility], [place] [address] [place], [place] [address]    Report Status 11/22/2018 FINAL  Final  Blood culture (routine x 2)     Status: None   Collection Time: 11/20/18  6:17 PM   Specimen: BLOOD RIGHT HAND  Result Value Ref Range Status   Specimen  Description BLOOD RIGHT HAND  Final   Special Requests   Final    BOTTLES DRAWN AEROBIC AND ANAEROBIC Blood Culture adequate volume   Culture   Final    NO GROWTH 5 DAYS Performed at [facility], [address], [place], [place] [address]    Report Status 11/25/2018 FINAL  Final  Novel Coronavirus,NAA,(SEND-OUT TO REF LAB - Rayya Yagi 24-48 hrs); Hosp Order     Status: None   Collection Time: 11/20/18  7:13 PM   Specimen: Oropharyngeal swab; Respiratory  Result Value Ref Range Status   SARS-CoV-2, NAA NOT DETECTED NOT DETECTED Final    Comment: (NOTE) This test was developed and its performance characteristics determined by [employer]. This test has not been FDA cleared or approved. This test has been authorized by FDA under an Emergency Use Authorization (EUA). This test is only authorized for the duration of time the declaration that circumstances exist justifying the authorization of the emergency use of in vitro diagnostic tests for detection of SARS-CoV-2 virus and/or diagnosis of COVID-19 infection under section 564(b)(1) of the Act, 21 U.S.C. 654YTK-3(T)(4), unless the authorization is terminated or revoked sooner. When diagnostic testing is negative, the possibility of a false negative result should be considered in the context of a patient's recent exposures and the presence of clinical signs and symptoms consistent with COVID-19. An individual without symptoms of COVID-19 and who is not shedding SARS-CoV-2 virus would expect to have a negative (not detected) result in this assay. Performed  At: [facility] [address] [place], [place] [id number] Rush Farmer MD ZG:[id number]    [place]  Final    Comment: Performed at [facility], [address], [place], [place] [address]  CSF culture     Status: None   Collection Time: 11/20/18  7:13 PM   Specimen: CSF; Cerebrospinal Fluid  Result Value Ref Range Status   Specimen  Description   Final    CSF Performed at [facility], [address], [place], [place] [address]    Special Requests   Final    NONE Performed at [facility], [address], [place], [place] [address]    Gram Stain   Final    NO CELLS OR ORGANISMS SEEN Gram Stain Report Called to,Read Back By and Verified With: NICHOLS,K. AT 2011 ON 11/20/2018 BY EVA Performed at [facility], [address], [place], [place] [address]    Culture   Final    NO GROWTH 3 DAYS Performed at [facility], [place] [address], [place], [place] [address]    Report Status 11/24/2018 FINAL  Final  MRSA PCR Screening     Status: None   Collection Time: 11/20/18 10:05 PM   Specimen: Nasal Mucosa; Nasopharyngeal  Result Value Ref Range Status   MRSA by PCR NEGATIVE NEGATIVE  Final    Comment:        The GeneXpert MRSA Assay (FDA approved for NASAL specimens only), is one component of a comprehensive MRSA colonization surveillance program. It is not intended to diagnose MRSA infection nor to guide or monitor treatment for MRSA infections. Performed at [facility], [address], [place], [place] [address]      Labs: Basic Metabolic Panel: Recent Labs  Lab 11/20/18 1949  11/22/18 0443 11/23/18 0514 11/24/18 0542 11/25/18 0557 11/26/18 0626  NA  --    < > 141 141 142 142 140  K  --    < > 3.7 3.5 4.0 3.7 3.5  CL  --    < > 114* 109 108 108 105  CO2  --    < > 21* '24 22 24 24  ' GLUCOSE  --    < > 111* 114* 96 96 90  BUN  --    < > 33* 24* '14 11 9  ' CREATININE  --    < > 3.99* 2.88* 2.14* 1.61* 1.27*  CALCIUM  --    < > 8.3* 8.4* 8.6* 8.6* 8.6*  MG 2.8*  --   --   --   --   --   --    < > = values in this interval not displayed.   Liver Function Tests: Recent Labs  Lab 11/22/18 0443 11/23/18 0514 11/24/18 0542 11/25/18 0557 11/26/18 0626  AST 162* 256* 324* 282* 213*  ALT 57* 105* 142* 155* 149*  ALKPHOS 38 41 42 43 45  BILITOT 1.3* 1.2 0.9 1.1 1.0  PROT 5.6* 6.0* 5.9* 6.3* 5.9*   ALBUMIN 3.1* 3.2* 3.1* 3.2* 3.1*   No results for input(s): LIPASE, AMYLASE in the last 168 hours. No results for input(s): AMMONIA in the last 168 hours. CBC: Recent Labs  Lab 11/20/18 1658 11/21/18 0413 11/22/18 0443 11/23/18 0514 11/25/18 0557  WBC 24.5* 13.9* 10.4 7.1 6.1  NEUTROABS 22.5*  --   --   --  3.6  HGB 14.3 11.4* 10.7* 11.2* 11.3*  HCT 42.0 34.3* 33.0* 33.5* 33.6*  MCV 88.4 90.3 91.2 88.2 88.9  PLT 241 146* 128* 171 193   Cardiac Enzymes: Recent Labs  Lab 11/22/18 0443 11/23/18 0514 11/24/18 0542 11/25/18 0557 11/26/18 0626  CKTOTAL 11,990* 26,928* 32,362* 23,067* 12,315*   BNP: Invalid input(s): POCBNP CBG: Recent Labs  Lab 11/23/18 1108 11/23/18 1606 11/24/18 0000 11/24/18 0537 11/24/18 1146  GLUCAP 106* 99 104* 102* 84    Time coordinating discharge:  36 minutes  Signed:  Orson Eva, DO [facility] Pager: [phone] 11/26/2018, 6:00 PM

## 2018-11-26 NOTE — Discharge Instructions (Signed)
° °  The items listed above may not be a complete list of foods and beverages you should avoid. Contact your dietitian for more information. Summary  Dialysis is a procedure that is done when the kidneys have stopped working properly (kidney failure).  If you are undergoing dialysis, it is important to pay careful attention to what you eat. Between dialysis sessions, certain nutrients and wastes can build up in your blood and cause you to get sick.  Your dietitian will help you design an eating plan that is specific to your needs.  Avoid foods that are high in sodium and phosphorus. Restrict fluids as told by your health care provider or dietitian. This information is not intended to replace advice given to you by your health care provider. Make sure you discuss any questions you have with your health care provider. Document Released: 02/20/2004 Document Revised: 08/11/2017 Document Reviewed: 05/26/2017 Elsevier Interactive Patient Education  2019 [place]. 1)Please make an appointment for follow-up visit with neurologist Dr. Phillips Odor in 1 to 2 weeks--please call neurology office at [phone] to make appointment address: [address], [place], [place] [address] Phone: [phone]  2)Per [facility] statutes, patients with seizures are not allowed to drive until they have been seizure-free for six months.   Use caution when using heavy equipment or power tools. Avoid working on ladders or at heights. Take showers instead of baths, Do not lock yourself in a room alone (i.e. bathroom).. Ensure the water temperature is not too high on the home water heater. Do not go swimming alone. When caring for infants or small children, sit down when holding, feeding, or changing them to minimize risk of injury to the child in the event you have a seizure.   Do not lock yourself in a room alone (i.e. bathroom).  Maintain good sleep hygiene. Avoid alcohol.    If patient has another  seizure, call 911 and bring them back to the ED if: A.  The seizure lasts longer than 5 minutes.      B.  The patient doesn't wake shortly after the seizure or has new problems such as difficulty seeing, speaking or moving following the seizure C.  The patient was injured during the seizure D.  The patient has a temperature over 102 F (39C) E.  The patient vomited during the seizure and now is having trouble breathing

## 2018-11-28 LAB — DRUG PROFILE, UR, 9 DRUGS (LABCORP)
Amphetamines, Urine: NEGATIVE ng/mL
Barbiturate, Ur: NEGATIVE ng/mL
Benzodiazepine Quant, Ur: NEGATIVE ng/mL
Cannabinoid Quant, Ur: POSITIVE ng/mL — AB
Cocaine (Metab.): NEGATIVE ng/mL
Methadone Screen, Urine: NEGATIVE ng/mL
Opiate Quant, Ur: NEGATIVE ng/mL
Phencyclidine, Ur: NEGATIVE ng/mL
Propoxyphene, Urine: NEGATIVE ng/mL

## 2018-11-28 LAB — STRIATED MUSCLE ANTIBODY: Anti-striation Abs: NEGATIVE

## 2018-11-29 LAB — ANTINUCLEAR ANTIBODIES, IFA: ANA Ab, IFA: NEGATIVE

## 2018-11-29 LAB — COXSACKIE B VIRUS ANTIBODIES
Coxsackie B1 Ab: 1:8 {titer} — ABNORMAL HIGH
Coxsackie B2 Ab: NEGATIVE
Coxsackie B3 Ab: NEGATIVE
Coxsackie B4 Ab: NEGATIVE
Coxsackie B5 Ab: NEGATIVE
Coxsackie B6 Ab: NEGATIVE

## 2018-11-29 LAB — COXSACKIE A VIRUS ANTIBODIES
Coxsackie A16 IgG: 1:400 {titer} — ABNORMAL HIGH
Coxsackie A16 IgM: NEGATIVE titer
Coxsackie A24 IgG: 1:400 {titer} — ABNORMAL HIGH
Coxsackie A24 IgM: NEGATIVE titer
Coxsackie A7 IgG: 1:400 {titer} — ABNORMAL HIGH
Coxsackie A7 IgM: NEGATIVE titer
Coxsackie A9 IgG: 1:400 {titer} — ABNORMAL HIGH
Coxsackie A9 IgM: NEGATIVE titer

## 2018-11-30 ENCOUNTER — Telehealth: Payer: Self-pay | Admitting: Neurology

## 2018-11-30 LAB — CK ISOENZYMES
CK-BB: 0 %
CK-MB: 0 % (ref 0–3)
CK-MM: 100 % (ref 97–100)
Creatine Kinase-Total: 20233 U/L (ref 32–182)
Macro Type 1: 0 %
Macro Type 2: 0 %

## 2018-11-30 NOTE — Telephone Encounter (Signed)
Pt called states she is going back to [place] area and would like Dr. Carles Collet to send a referral for neurology there. Pt request [facility] affiliated neurologist. Pt wishes return call to her cell [phone].

## 2018-11-30 NOTE — Telephone Encounter (Signed)
[place] for referral [facility] Neurosurgery to make sure we are sending to correct place No answer left message to call office back

## 2018-11-30 NOTE — Telephone Encounter (Signed)
I don't think that I have ever seen this patient.

## 2018-11-30 NOTE — Telephone Encounter (Signed)
Called patient she is confused she states that Dr. Carles Collet (Female provider) referred her to this office.

## 2018-11-30 NOTE — Telephone Encounter (Signed)
Spoke with patient she was scheduled with Dr. Posey Pronto then cancel because she wanted to see a neurologist in [place], [place]  Pt was informed to call PCP to request referral to Neurologist in [place], [place] we can not refer a patient we have never seen.  Pt requesting to R/S appt with Dr. Posey Pronto   Not sure if this can been done will ask front desk

## 2019-01-06 ENCOUNTER — Encounter: Payer: Self-pay | Admitting: Neurology

## 2019-01-09 ENCOUNTER — Other Ambulatory Visit: Payer: Self-pay

## 2019-01-09 ENCOUNTER — Encounter: Payer: Self-pay | Admitting: Neurology

## 2019-01-09 ENCOUNTER — Ambulatory Visit: Payer: BC Managed Care – PPO | Admitting: Neurology

## 2019-01-09 VITALS — BP 126/78 | HR 92 | Ht 64.0 in | Wt 157.0 lb

## 2019-01-09 DIAGNOSIS — G40909 Epilepsy, unspecified, not intractable, without status epilepticus: Secondary | ICD-10-CM

## 2019-01-09 MED ORDER — LEVETIRACETAM 500 MG PO TABS: 500.0000 mg | ORAL_TABLET | Freq: Two times a day (BID) | ORAL | 2 refills | Status: DC

## 2019-01-09 NOTE — Patient Instructions (Addendum)
Continue Keppra 500mg  twice daily  We will refer you to Dr. Milford Cage, MD in [place], [place].  Avoid alcohol and vaping  Do not drive until seizure-free for 6 months

## 2019-01-09 NOTE — Progress Notes (Signed)
[facility] Neurology [facility] Clinic Note - Initial Visit   Date: 01/09/19  Carla HeronJessica R Gross MRN: [id number] DOB: [birth year]   Dear Dr. Arbutus Leasat:  Thank you for your kind referral of Carla Gross for consultation of hyperCKemia. Although her history is well known to you, please allow us to reiterate it for the purpose of our medical record. The patient was accompanied to the clinic by self.   History of Present Illness: Carla Gross is a 23 y.o. 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  She lives in [place], [place] and was visiting a friend in [place] on June 13th.  She reports being outside at the pool most of the day and consumed 5 beers that day.  She came inside around 7pm for dinner and developed headache for which she took 3 Aleve.  She does not recall much of the following events, but was told she had bloody emesis and had urinary incontinence in her sleep.  She was very somnolent and her friends were unable to arouse her the following morning.  She then had a seizure.  EMS arrived and took [facility] to [facility].  During her hospitalization, her creatinine and creatine kinase trended up and peaked at she was combative and administered Ativan.  She was started on Keppra 500 mg twice daily and remained seizure-free.  Routine EEG showed generalized intermittent rhythmic delta activity, seen in toxic metabolic process.  There were no epileptiform discharges.  CSF testing showed elevated glucose, normal cell count and protein.  She was unable to get MRI brain due to piercings in the ear.  While hospitalized, she had elevated CK and Cr which peaked at 32,262 and 3.99, respectively.  With hydration, her labs slowly improved.  Myopathy evaluation including rheumatological work-up was negative.  Most recent labs from 12/29/2018 showed creatinine 0.95 and CK 60.  She did not have any weakness of the arms or legs when hospitalized.  She has been  compliant with her medications, but would like to wean off of it.  She has not had any further seizures.  She denies any muscle weakness or myalgias.  She continues to vape and use marijuana regularly.  She has not been drinking alcohol.  Out-side paper records, electronic medical record, and images have been reviewed where available and summarized as:  Labs 12/29/2018: CBC normal, sodium 142, potassium 4.9, chloride 109, BUN 10, creatinine 0.95, glucose 87, AST 10, ALT 12, CK 60   Lab Results  Component Value Date   CKTOTAL 12,315 (H) 11/26/2018   CKMB 0 11/25/2018    No results found for: HGBA1C No results found for: VITAMINB12 No results found for: TSH Lab Results  Component Value Date   ESRSEDRATE 45 (H) 11/25/2018    Past Medical History:  Diagnosis Date  . Acute kidney injury (HCC)   . Medical history non-contributory   . Non-traumatic rhabdomyolysis     Past Surgical History:  Procedure Laterality Date  . WISDOM TOOTH EXTRACTION       Medications:  Outpatient Encounter Medications as of 01/09/2019  Medication Sig  . levETIRAcetam (KEPPRA) 500 MG tablet Take 1 tablet (500 mg total) by mouth 2 (two) times daily.  . Norethindrone-Ethinyl Estradiol-Fe Biphas (LO LOESTRIN FE) 1 MG-10 MCG / 10 MCG tablet Take 1 tablet by mouth daily.  . [DISCONTINUED] levETIRAcetam (KEPPRA) 500 MG tablet Take by mouth.  . [DISCONTINUED] Norethindrone-Ethinyl Estradiol-Fe Biphas (LO LOESTRIN FE) 1 MG-10 MCG / 10 MCG tablet  Take by mouth.  . lidocaine (XYLOCAINE) 2 % solution Use as directed 15 mLs in the mouth or throat every 4 (four) hours as needed for mouth pain. (Patient not taking: Reported on 01/09/2019)   No facility-administered encounter medications on file as of 01/09/2019.     Allergies: No Known Allergies  Family History: Family History  Problem Relation Age of Onset  . Other Mother        cervical cancer    Social History: Social History   Tobacco Use  . Smoking status:  Current Every Day Smoker    Types: E-cigarettes  . Smokeless tobacco: Former Network engineer Use Topics  . Alcohol use: Yes  . Drug use: Yes    Types: Marijuana   Social History   Social History Narrative   Lives in an apartment with boyfriend   Right handed    Review of Systems:  CONSTITUTIONAL: No fevers, chills, night sweats, or weight loss.   EYES: No visual changes or eye pain ENT: No hearing changes.  No history of nose bleeds.   RESPIRATORY: No cough, wheezing and shortness of breath.   CARDIOVASCULAR: Negative for chest pain, and palpitations.   GI: Negative for abdominal discomfort, blood in stools or black stools.  No recent change in bowel habits.   GU:  No history of incontinence.   MUSCLOSKELETAL: No history of joint pain or swelling.  No myalgias.   SKIN: Negative for lesions, rash, and itching.   HEMATOLOGY/ONCOLOGY: Negative for prolonged bleeding, bruising easily, and swollen nodes.  No history of cancer.   ENDOCRINE: Negative for cold or heat intolerance, polydipsia or goiter.   PSYCH:  No depression or anxiety symptoms.   NEURO: As Above.   Vital Signs:  BP 126/78   Pulse 92   Ht 5\' 4"  (1.626 m)   Wt 157 lb (71.2 kg)   SpO2 99%   BMI 26.95 kg/m    General Medical Exam:   General:  Well appearing, comfortable.   Eyes/ENT: see cranial nerve examination.  Healing tongue laceration on the right side Neck:   No carotid bruits. Respiratory:  Clear to auscultation, good air entry bilaterally.   Cardiac:  Regular rate and rhythm, no murmur.   Extremities:  No deformities, edema, or skin discoloration.  Skin:  No rashes or lesions.  Neurological Exam: MENTAL STATUS including orientation to time, place, person, recent and remote memory, attention span and concentration, language, and fund of knowledge is normal.  Speech is not dysarthric.  CRANIAL NERVES: II:  No visual field defects.  Unremarkable fundi.   III-IV-VI: Pupils equal round and reactive to  light.  Normal conjugate, extra-ocular eye movements in all directions of gaze.  No nystagmus.  No ptosis.   V:  Normal facial sensation.    VII:  Normal facial symmetry and movements.   VIII:  Normal hearing and vestibular function.   IX-X:  Normal palatal movement.   XI:  Normal shoulder shrug and head rotation.   XII:  Normal tongue strength and range of motion, no deviation or fasciculation.  MOTOR:  No atrophy, fasciculations or abnormal movements.  No pronator drift.   Upper Extremity:  Right  Left  Deltoid  5/5   5/5   Biceps  5/5   5/5   Triceps  5/5   5/5   Infraspinatus 5/5  5/5  Medial pectoralis 5/5  5/5  Wrist extensors  5/5   5/5   Wrist flexors  5/5  5/5   Finger extensors  5/5   5/5   Finger flexors  5/5   5/5   Dorsal interossei  5/5   5/5   Abductor pollicis  5/5   5/5   Tone (Ashworth scale)  0  0   Lower Extremity:  Right  Left  Hip flexors  5/5   5/5   Hip extensors  5/5   5/5   Adductor 5/5  5/5  Abductor 5/5  5/5  Knee flexors  5/5   5/5   Knee extensors  5/5   5/5   Dorsiflexors  5/5   5/5   Plantarflexors  5/5   5/5   Toe extensors  5/5   5/5   Toe flexors  5/5   5/5   Tone (Ashworth scale)  0  0   MSRs:  Right        Left                  brachioradialis 2+  2+  biceps 2+  2+  triceps 2+  2+  patellar 2+  2+  ankle jerk 2+  2+  Hoffman no  no  plantar response down  down   SENSORY:  Normal and symmetric perception of light touch, pinprick, vibration, and proprioception.  Romberg's sign absent.   COORDINATION/GAIT: Normal finger-to- nose-finger and heel-to-shin.  Intact rapid alternating movements bilaterally.  Able to rise from a chair without using arms.  Easily able to stand up from low squat. Gait narrow based and stable. Tandem and stressed gait intact.    IMPRESSION: 1.  New onset seizure, ?provoked in the setting of alcohol/vape vs new onset seizure disorder.  I recommend MRI brain and 24-hr ambulatory EEG to further evaluate which  she will have after establishing care closer to home.   Continue Keppra 500mg  twice daily.   Do not drive.   She was strongly advised against vaping, alcohol, and using marijuana She lives in [place], [place] and would like to transition her care locally.  I will refer her to see Dr. Harlow OhmsJessica Danison, epileptologist, at [facility].     2.  Rhabdomyolysis in the setting of seizure.   No signs of primary neuromuscular pathology.  If she develops rhabdomyolysis again, additional work-up would be recommended.   Thank you for allowing me to participate in patient's care.  If I can answer any additional questions, I would be pleased to do so.    Sincerely,    Jonavin Seder K. Allena KatzPatel, DO

## 2019-01-11 ENCOUNTER — Telehealth: Payer: Self-pay

## 2019-01-11 NOTE — Telephone Encounter (Signed)
Dr.Shianna Danison, is no longer at that practice, please readvise on Provider

## 2019-01-12 NOTE — Telephone Encounter (Signed)
Sent referral and faxed notes with insurance.Number given to patient to contact office

## 2019-01-12 NOTE — Telephone Encounter (Signed)
OK to send new patient referral for Seizure disorder to the practice and let them decide who the appropriate doctor would be.

## 2019-04-18 ENCOUNTER — Other Ambulatory Visit: Payer: Self-pay | Admitting: Neurology

## 2019-08-26 ENCOUNTER — Other Ambulatory Visit: Payer: Self-pay | Admitting: Neurology

## 2020-05-26 IMAGING — CT CT HEAD WITHOUT CONTRAST
3 series · 15 of 47 positions shown, 18 images · non-contrast
Comparison: None.

CLINICAL DATA: Per ED note. Pt brought in by ems after family
called for possible seizure. Family reports she drank a few white
claws yesterday by the pool and got sick. Pt had multiple episodes
of urinating on self with vomiting. Today had a possible seizure per
ems and family gave cbd oil under tongue.

EXAM:
CT HEAD WITHOUT CONTRAST
TECHNIQUE: Contiguous axial images were obtained from the base of the skull
through the vertex without intravenous contrast.

[Series 2: head w o · axial · 0.40mm/px · z∈[+115,+240]mm · 9 of 31 slices shown, 12 images]
[im 3/31  brain]
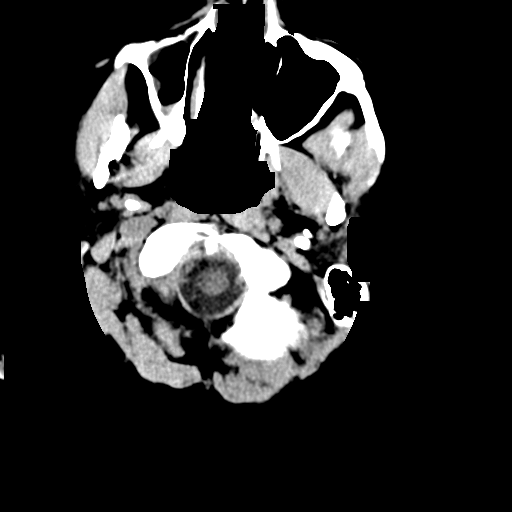
[im 3/31  bone]
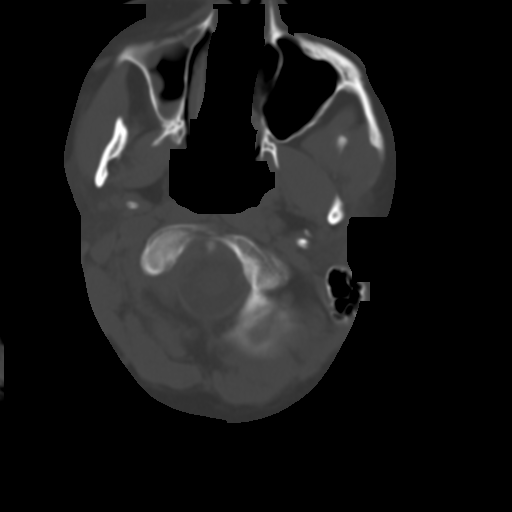
[im 6/31  brain]
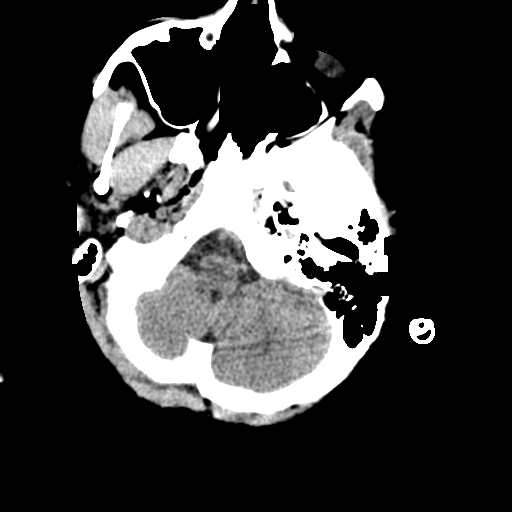
[im 9/31  brain]
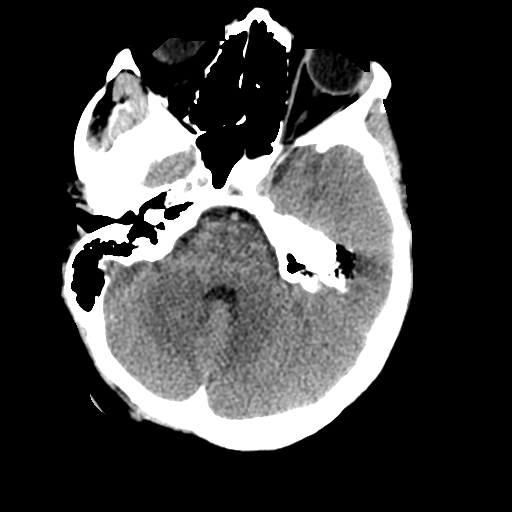
[im 12/31  brain]
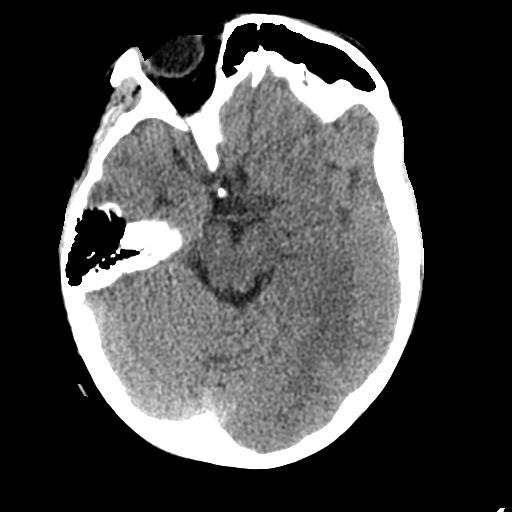
[im 16/31  brain]
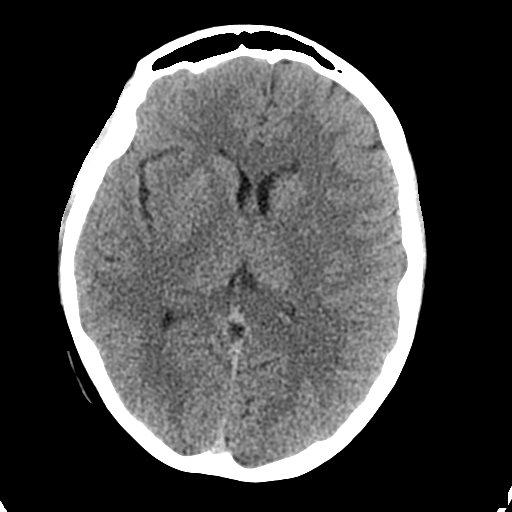
[im 16/31  bone]
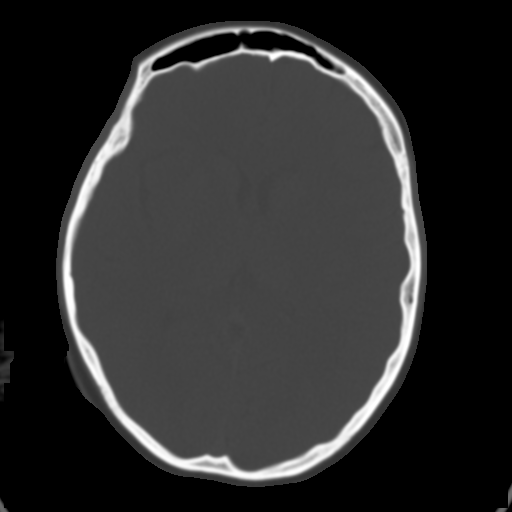
[im 19/31  brain]
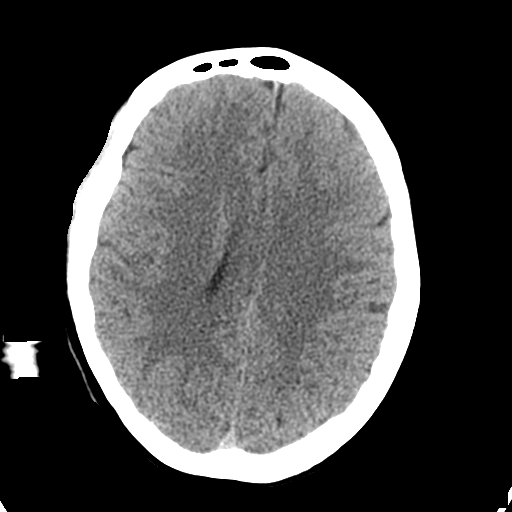
[im 22/31  brain]
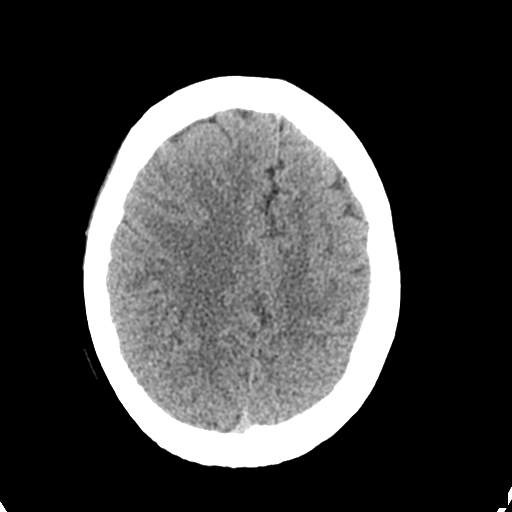
[im 25/31  brain]
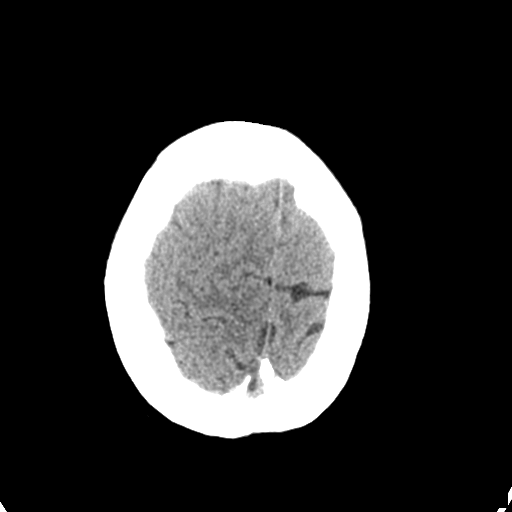
[im 28/31  brain]
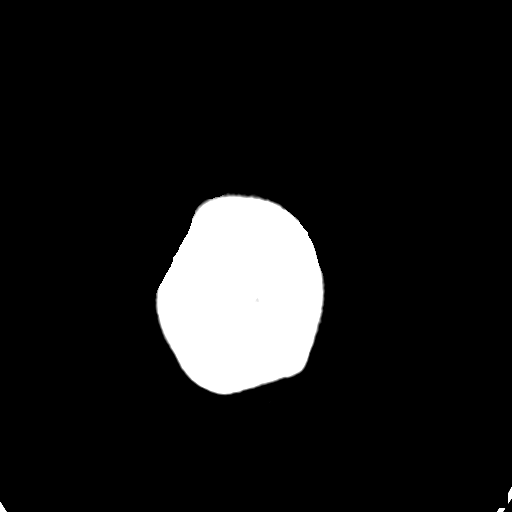
[im 28/31  bone]
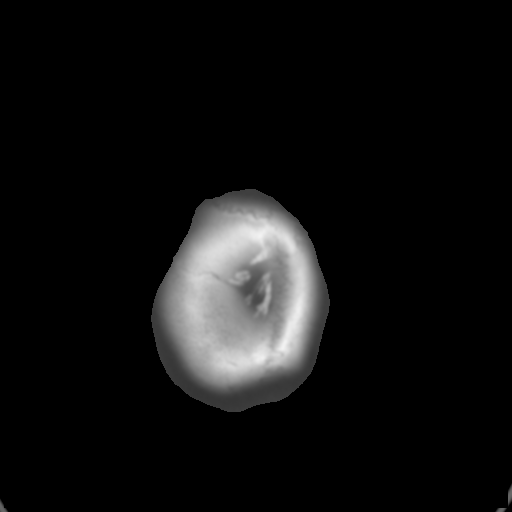

[Series 4: coronal soft · coronal · 0.36mm/px · 3 of 74 slices shown]
[im 25/74  brain]
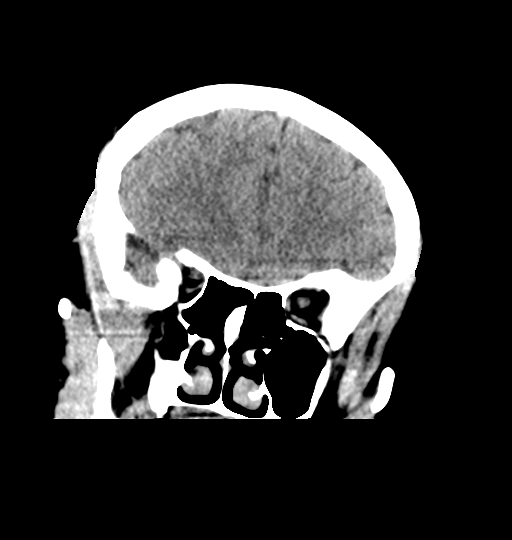
[im 33/74  brain]
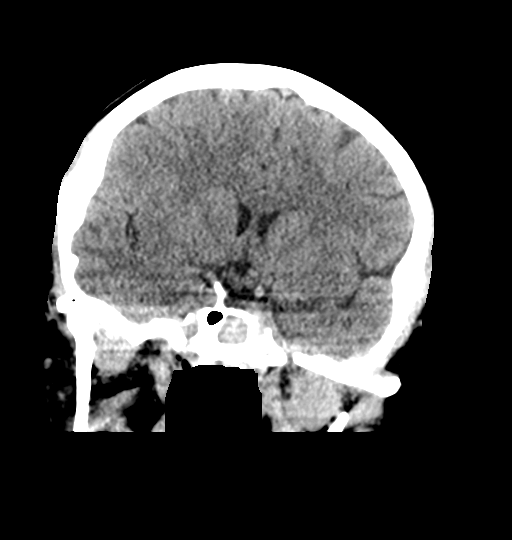
[im 41/74  brain]
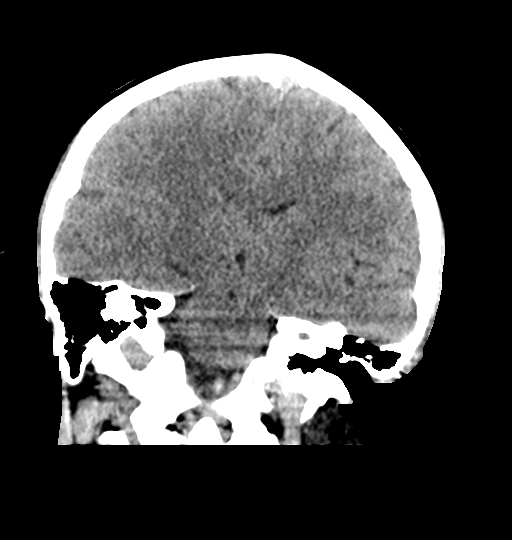

[Series 5: sagittal soft · sagittal · 0.35mm/px · 3 of 59 slices shown]
[im 20/59  brain]
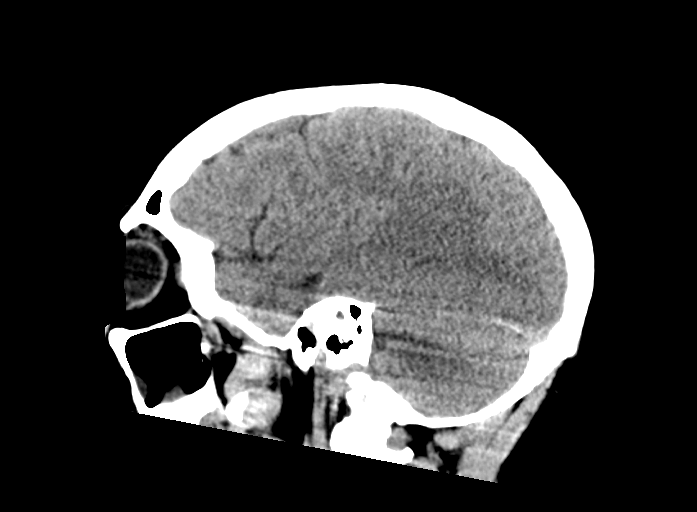
[im 30/59  brain]
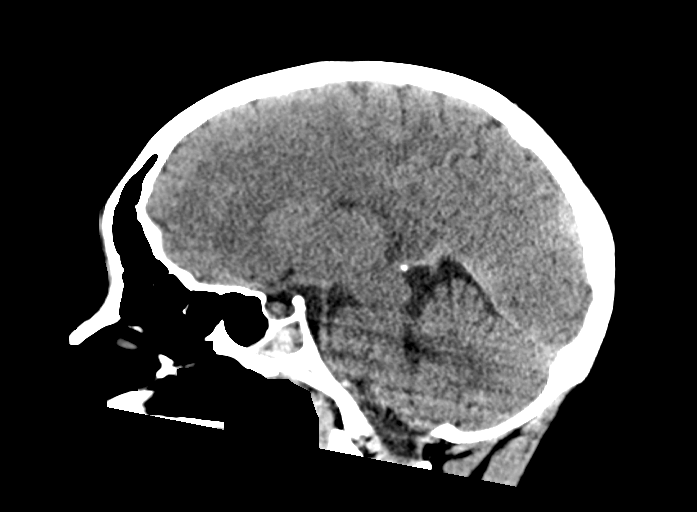
[im 39/59  brain]
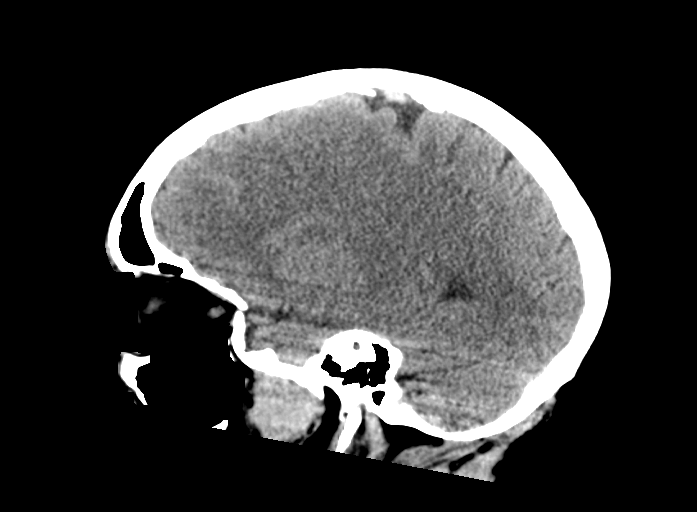

[15 of 47 positions shown; findings below may reference images not displayed]

FINDINGS: Brain: No evidence of acute infarction, hemorrhage, hydrocephalus,
extra-axial collection or mass lesion/mass effect.

Vascular: No hyperdense vessel or unexpected calcification.

Skull: Normal. Negative for fracture or focal lesion.

Sinuses/Orbits: Globes and orbits are unremarkable.

Mucous retention cyst in the left sphenoid sinus. Mild, right
greater than left, polypoid mucosal thickening in the inferior
maxillary sinuses. Clear mastoid air cells.

Other: None.
IMPRESSION: 1. No intracranial abnormality.
2. Sinus mucosal thickening as described.
